# Patient Record
Sex: Male | Born: 1990 | Race: Black or African American | Hispanic: No | Marital: Single | Smoking: Current every day smoker
Health system: Southern US, Community
[De-identification: ages and names within clinical notes are randomized; demographics above are authoritative.]

---

## 2014-04-20 ENCOUNTER — Emergency Department (HOSPITAL_COMMUNITY)
Admission: EM | Admit: 2014-04-20 | Discharge: 2014-04-20 | Disposition: A | Discharge status: Home / Self Care | Payer: Self-pay | Attending: Emergency Medicine | Admitting: Emergency Medicine

## 2014-04-20 ENCOUNTER — Encounter (HOSPITAL_COMMUNITY): Payer: Self-pay | Admitting: Emergency Medicine

## 2014-04-20 ENCOUNTER — Emergency Department (HOSPITAL_COMMUNITY): Payer: Self-pay

## 2014-04-20 DIAGNOSIS — S0083XA Contusion of other part of head, initial encounter: Secondary | ICD-10-CM

## 2014-04-20 DIAGNOSIS — Z720 Tobacco use: Secondary | ICD-10-CM | POA: Insufficient documentation

## 2014-04-20 DIAGNOSIS — Y9389 Activity, other specified: Secondary | ICD-10-CM | POA: Insufficient documentation

## 2014-04-20 DIAGNOSIS — Y998 Other external cause status: Secondary | ICD-10-CM | POA: Insufficient documentation

## 2014-04-20 DIAGNOSIS — X58XXXA Exposure to other specified factors, initial encounter: Secondary | ICD-10-CM | POA: Insufficient documentation

## 2014-04-20 DIAGNOSIS — Y9289 Other specified places as the place of occurrence of the external cause: Secondary | ICD-10-CM | POA: Insufficient documentation

## 2014-04-20 MED ORDER — TRAMADOL HCL 50 MG PO TABS: 50.0000 mg | ORAL_TABLET | Freq: Four times a day (QID) | ORAL | Status: DC | PRN

## 2014-04-20 MED ORDER — ONDANSETRON HCL 4 MG PO TABS: 4.0000 mg | ORAL_TABLET | Freq: Four times a day (QID) | ORAL | Status: DC

## 2014-04-20 MED ORDER — MECLIZINE HCL 25 MG PO TABS: 25.0000 mg | ORAL_TABLET | Freq: Four times a day (QID) | ORAL | Status: DC

## 2014-04-20 MED ORDER — TETRACAINE HCL 0.5 % OP SOLN
2.0000 [drp] | Freq: Once | OPHTHALMIC | Status: AC
  Administered 2014-04-20: 2 [drp] via OPHTHALMIC
  Filled 2014-04-20: qty 2

## 2014-04-20 MED ORDER — FLUORESCEIN SODIUM 1 MG OP STRP
1.0000 | ORAL_STRIP | Freq: Once | OPHTHALMIC | Status: AC
  Administered 2014-04-20: 1 via OPHTHALMIC
  Filled 2014-04-20 (×2): qty 1

## 2014-04-20 NOTE — Discharge Instructions (Signed)
Facial or Scalp Contusion A facial or scalp contusion is a deep bruise on the face or head. Injuries to the face and head generally cause a lot of swelling, especially around the eyes. Contusions are the result of an injury that caused bleeding under the skin. The contusion may turn blue, purple, or yellow. Minor injuries will give you a painless contusion, but more severe contusions may stay painful and swollen for a few weeks.  CAUSES  A facial or scalp contusion is caused by a blunt injury or trauma to the face or head area.  SIGNS AND SYMPTOMS   Swelling of the injured area.   Discoloration of the injured area.   Tenderness, soreness, or pain in the injured area.  DIAGNOSIS  The diagnosis can be made by taking a medical history and doing a physical exam. An X-ray exam, CT scan, or MRI may be needed to determine if there are any associated injuries, such as broken bones (fractures). TREATMENT  Often, the best treatment for a facial or scalp contusion is applying cold compresses to the injured area. Over-the-counter medicines may also be recommended for pain control.  HOME CARE INSTRUCTIONS   Only take over-the-counter or prescription medicines as directed by your health care provider.   Apply ice to the injured area.   Put ice in a plastic bag.   Place a towel between your skin and the bag.   Leave the ice on for 20 minutes, 2-3 times a day.  SEEK MEDICAL CARE IF:  You have bite problems.   You have pain with chewing.   You are concerned about facial defects. SEEK IMMEDIATE MEDICAL CARE IF:  You have severe pain or a headache that is not relieved by medicine.   You have unusual sleepiness, confusion, or personality changes.   You throw up (vomit).   You have a persistent nosebleed.   You have double vision or blurred vision.   You have fluid drainage from your nose or ear.   You have difficulty walking or using your arms or legs.  MAKE SURE YOU:    Understand these instructions.  Will watch your condition.  Will get help right away if you are not doing well or get worse. Document Released: 04/19/2004 Document Revised: 12/31/2012 Document Reviewed: 10/23/2012 [facility] Patient Information 2015 [employer], [place]. This information is not intended to replace advice given to you by your health care provider. Make sure you discuss any questions you have with your health care provider. Eye Contusion An eye contusion is a deep bruise of the eye. This is often called a "black eye." Contusions are the result of an injury that caused bleeding under the skin. The contusion may turn blue, purple, or yellow. Minor injuries will give you a painless contusion, but more severe contusions may stay painful and swollen for a few weeks. If the eye contusion only involves the eyelids and tissues around the eye, the injured area will get better within a few days to weeks. However, eye contusions can be serious and affect the eyeball and sight. CAUSES   Blunt injury or trauma to the face or eye area.  A forehead injury that causes the blood under the skin to work its way down to the eyelids.  Rubbing the eyes due to irritation. SYMPTOMS   Swelling and redness around the eye.  Bruising around the eye.  Tenderness, soreness, or pain around the eye.  Blurry vision.  Tearing.  Eyeball redness. DIAGNOSIS  A diagnosis is  usually based on a thorough exam of the eye and surrounding area. The eye must be looked at carefully to make sure it is not injured and to make sure nothing else will threaten your vision. A vision test may be done. An X-ray or computed tomography (CT) scan may be needed to determine if there are any associated injuries, such as broken bones (fractures). TREATMENT  If there is an injury to the eye, treatment will be determined by the nature of the injury. HOME CARE INSTRUCTIONS   Put ice on the injured area.  Put ice in a plastic  bag.  Place a towel between your skin and the bag.  Leave the ice on for 15-20 minutes, 03-04 times a day.  If it is determined that there is no injury to the eye, you may continue normal activities.  Sunglasses may be worn to protect your eyes from bright light if light is uncomfortable.  Sleep with your head elevated. You can put an extra pillow under your head. This may help with discomfort.  Only take over-the-counter or prescription medicines for pain, discomfort, or fever as directed by your caregiver. Do not take aspirin for the first few days. This may increase bruising. SEEK IMMEDIATE MEDICAL CARE IF:   You have any form of vision loss.  You have double vision.  You feel nauseous.  You feel dizzy, sleepy, or like you will faint.  You have any fluid discharge from the eye or your nose.  You have swelling and discoloration that does not fade. MAKE SURE YOU:   Understand these instructions.  Will watch your condition.  Will get help right away if you are not doing well or get worse. Document Released: 03/09/2000 Document Revised: 06/04/2011 Document Reviewed: 01/26/2011 [facility] Patient Information 2015 [place], [place]. This information is not intended to replace advice given to you by your health care provider. Make sure you discuss any questions you have with your health care provider.
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
## 2016-06-15 ENCOUNTER — Emergency Department (HOSPITAL_COMMUNITY)
Admission: EM | Admit: 2016-06-15 | Discharge: 2016-06-15 | Disposition: A | Discharge status: Home / Self Care | Payer: Self-pay | Attending: Dermatology | Admitting: Dermatology

## 2016-06-15 ENCOUNTER — Encounter (HOSPITAL_COMMUNITY): Payer: Self-pay | Admitting: *Deleted

## 2016-06-15 DIAGNOSIS — Z5321 Procedure and treatment not carried out due to patient leaving prior to being seen by health care provider: Secondary | ICD-10-CM | POA: Insufficient documentation

## 2016-06-15 DIAGNOSIS — M542 Cervicalgia: Secondary | ICD-10-CM | POA: Insufficient documentation
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
## 2016-07-01 ENCOUNTER — Emergency Department (HOSPITAL_COMMUNITY)
Admission: EM | Admit: 2016-07-01 | Discharge: 2016-07-01 | Disposition: A | Discharge status: Home / Self Care | Payer: Self-pay | Attending: Emergency Medicine | Admitting: Emergency Medicine

## 2016-07-01 ENCOUNTER — Encounter (HOSPITAL_COMMUNITY): Payer: Self-pay | Admitting: Emergency Medicine

## 2016-07-01 ENCOUNTER — Emergency Department (HOSPITAL_COMMUNITY): Payer: Self-pay

## 2016-07-01 DIAGNOSIS — M79674 Pain in right toe(s): Secondary | ICD-10-CM

## 2016-07-01 DIAGNOSIS — F17200 Nicotine dependence, unspecified, uncomplicated: Secondary | ICD-10-CM | POA: Insufficient documentation

## 2016-07-01 DIAGNOSIS — Y9389 Activity, other specified: Secondary | ICD-10-CM | POA: Insufficient documentation

## 2016-07-01 DIAGNOSIS — Y999 Unspecified external cause status: Secondary | ICD-10-CM | POA: Insufficient documentation

## 2016-07-01 DIAGNOSIS — Y929 Unspecified place or not applicable: Secondary | ICD-10-CM | POA: Insufficient documentation

## 2016-07-01 DIAGNOSIS — W228XXA Striking against or struck by other objects, initial encounter: Secondary | ICD-10-CM | POA: Insufficient documentation

## 2016-07-01 DIAGNOSIS — S90811A Abrasion, right foot, initial encounter: Secondary | ICD-10-CM | POA: Insufficient documentation

## 2016-07-01 DIAGNOSIS — Z79899 Other long term (current) drug therapy: Secondary | ICD-10-CM | POA: Insufficient documentation

## 2016-07-01 NOTE — Discharge Instructions (Signed)
Please clean your toe with soap and water.  Please wrap ear toe thereafter.  Ibuprofen or Tylenol as needed for discomfort.  Return if any signs of infection present.

## 2016-07-01 NOTE — ED Notes (Signed)
Discharge instructions reviewed with patient. Patient verbalized understanding. 

## 2016-07-01 NOTE — ED Provider Notes (Signed)
[facility]-EMERGENCY DEPT Provider Note   CSN: [id number] Arrival date & time: 07/01/16  1419 By signing my name below, I, Levon Hedger, attest that this documentation has been prepared under the direction and in the presence of non-physician practitioner, Eyvonne Mechanic, PA-C. Electronically Signed: Levon Hedger, Scribe. 07/01/2016. 3:33 PM.   History   Chief Complaint Chief Complaint  Patient presents with  . Foot Pain   HPI William Snow is a 26 y.o. male who presents to the Emergency Department complaining of sudden onset, constant, moderate right lateral foot pain onset last night. Pt states he struck his foot on a wooden bed board last night while chasing his dog. Pain is exacerbated by pressure and movement. He notes associated swelling and an abrasion to his right 2nd toe. No prior treatments indicated. Pt has no other acute complaints or associated symptoms at this time.  Tetanus UTD.   The history is provided by the patient. No language interpreter was used.   History reviewed. No pertinent past medical history.  There are no active problems to display for this patient.  History reviewed. No pertinent surgical history.   Home Medications    Prior to Admission medications   Medication Sig Start Date End Date Taking? Authorizing Provider  traMADol (ULTRAM) 50 MG tablet Take 1 tablet (50 mg total) by mouth every 6 (six) hours as needed. 04/20/14   Marlon Pel, PA-C   Family History No family history on file.  Social History Social History  Substance Use Topics  . Smoking status: Current Every Day Smoker  . Smokeless tobacco: Never Used  . Alcohol use Yes     Allergies   Patient has no known allergies.   Review of Systems Review of Systems 10 systems reviewed and are negative for acute change except as noted in the HPI.  Physical Exam Updated Vital Signs BP 109/62 (BP Location: Left Arm)   Pulse 62   Temp 98.1 F (36.7 C) (Oral)   Resp 16   SpO2 97%    Physical Exam  Constitutional: He is oriented to person, place, and time. He appears well-developed and well-nourished. No distress.  HENT:  Head: Normocephalic and atraumatic.  Eyes: Conjunctivae are normal.  Cardiovascular: Normal rate.   Pulmonary/Chest: Effort normal.  Abdominal: He exhibits no distension.  Musculoskeletal:  Superficial abrasion and minor bruising noted to the second digit right foot no full thickness wound, no other signs of trauma.  Neurological: He is alert and oriented to person, place, and time.  Skin: Skin is warm and dry.  Psychiatric: He has a normal mood and affect.  Nursing note and vitals reviewed.  ED Treatments / Results  DIAGNOSTIC STUDIES:  Oxygen Saturation is 100% on RA, normal by my interpretation.    COORDINATION OF CARE:  3:33 PM Discussed treatment plan with pt at bedside and pt agreed to plan.   Labs (all labs ordered are listed, but only abnormal results are displayed) Labs Reviewed - No data to display  EKG  EKG Interpretation None       Radiology Dg Foot Complete Right  Result Date: 07/01/2016 CLINICAL DATA:  Hit foot on bed frame today. Lateral right foot pain. Initial encounter. EXAM: RIGHT FOOT COMPLETE - 3+ VIEW COMPARISON:  None. FINDINGS: There is no evidence of fracture or dislocation. There is no evidence of arthropathy or other focal bone abnormality. Soft tissues are unremarkable. IMPRESSION: Negative. Electronically Signed   By: Myles Rosenthal M.D.   On: 07/01/2016 15:02  Procedures Procedures (including critical care time)  Medications Ordered in ED Medications - No data to display   Initial Impression / Assessment and Plan / ED Course  I have reviewed the triage vital signs and the nursing notes.  Pertinent labs & imaging results that were available during my care of the patient were reviewed by me and considered in my medical decision making (see chart for details).     Patient presents with  superficial abrasion to his toe, no acute fracture.  Tetanus is up-to-date, symptomatic care instructions given wound care instructions given.  Return precautions given.  Patient verbalized understanding and agreement to today's plan  Final Clinical Impressions(s) / ED Diagnoses   Final diagnoses:  Toe pain, right    New Prescriptions Discharge Medication List as of 07/01/2016  4:31 PM    I personally performed the services described in this documentation, which was scribed in my presence. The recorded information has been reviewed and is accurate.    Eyvonne Mechanic, PA-C 07/01/16 1703    Doug Sou, MD 07/02/16 [phone]

## 2016-07-01 NOTE — ED Triage Notes (Signed)
Pt c/o right foot pain onset last night after striking right 2nd toe of right foot on wooden bedboard. Pain to toe and dorsal foot. Small laceration to 2nd toe.

## 2017-05-03 IMAGING — CR DG FOOT COMPLETE 3+V*R*
3 series · 3 of 3 positions shown · non-contrast
Comparison: None.

CLINICAL DATA: Hit foot on bed frame today. Lateral right foot
pain. Initial encounter.

EXAM:
RIGHT FOOT COMPLETE - 3+ VIEW

[x foot lat right]
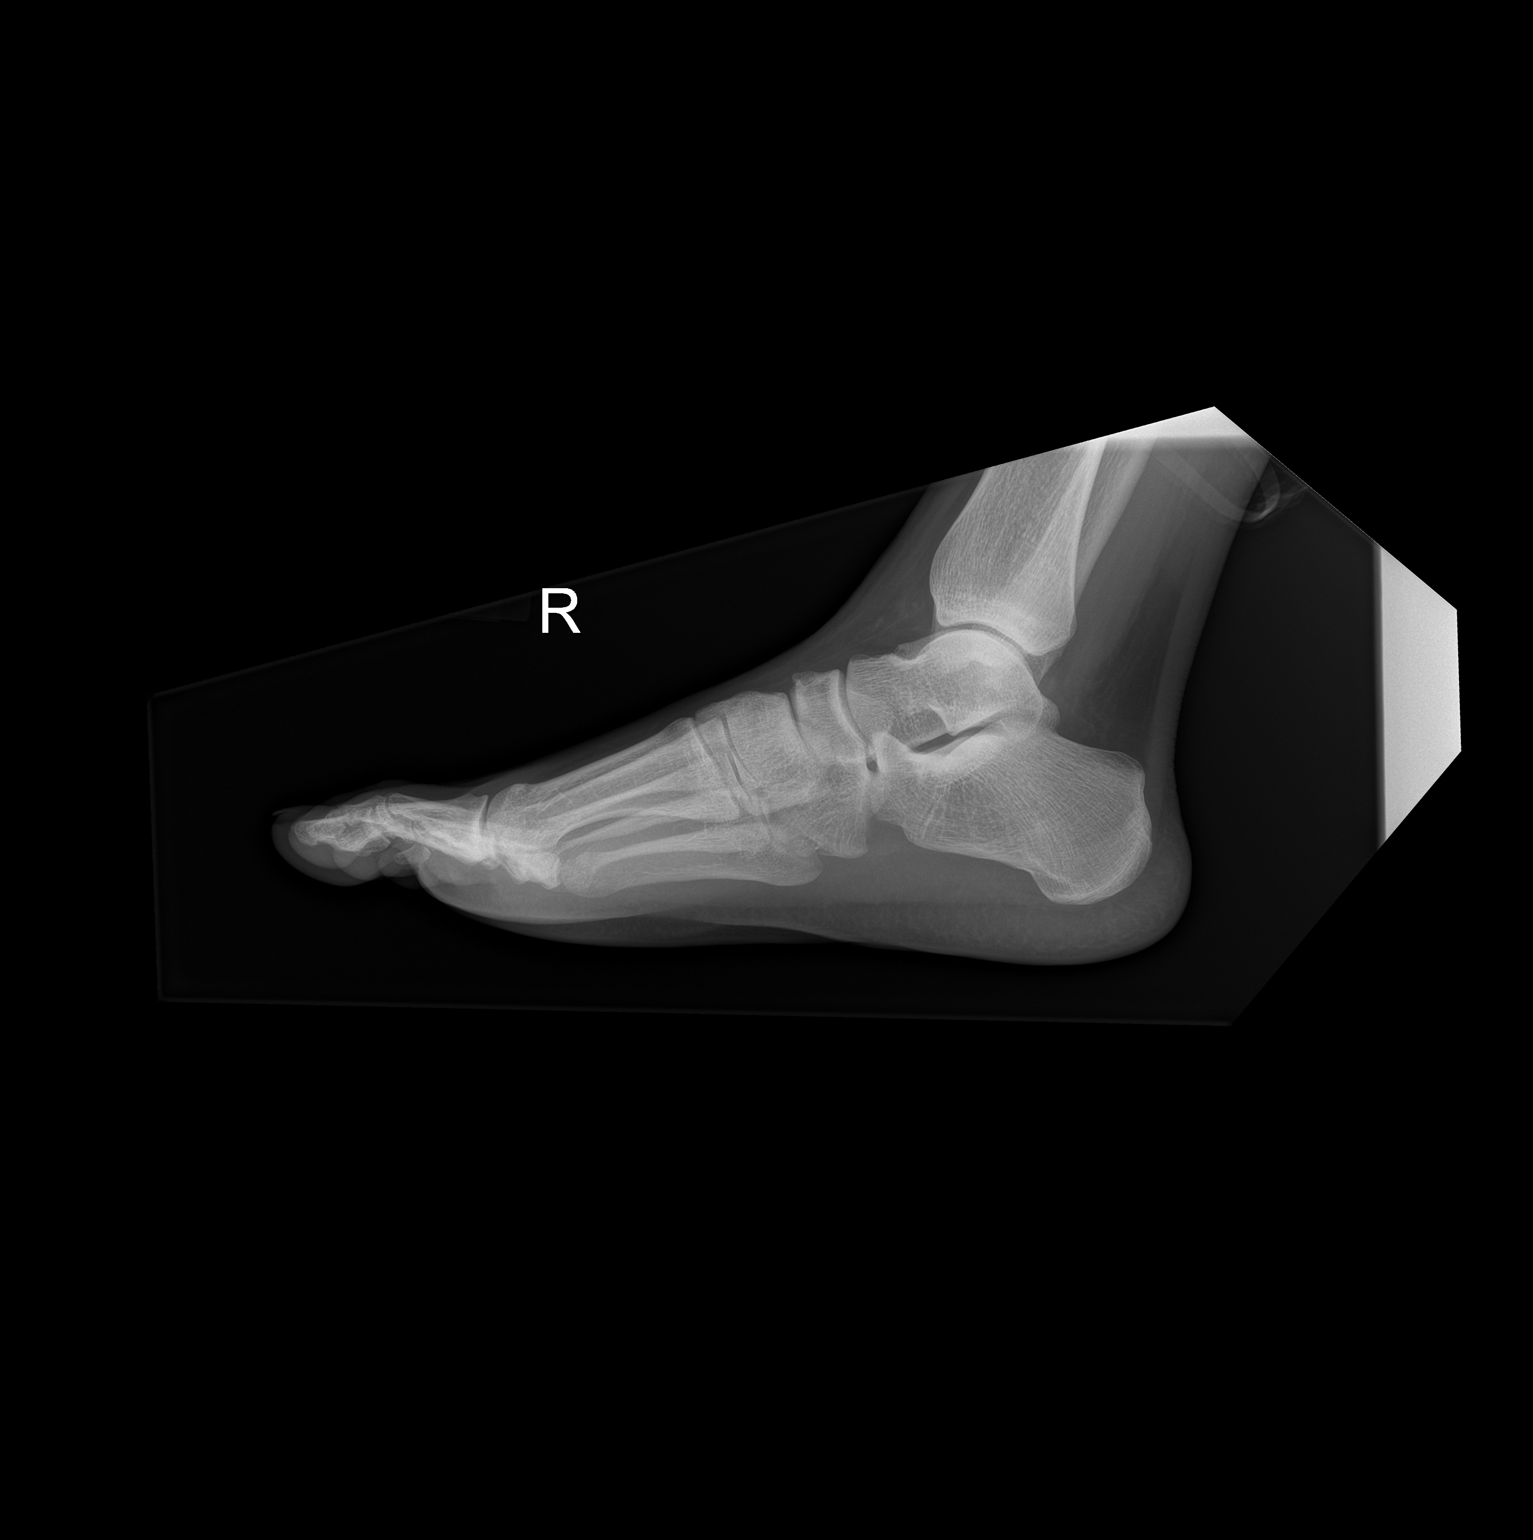

[x foot ap right]
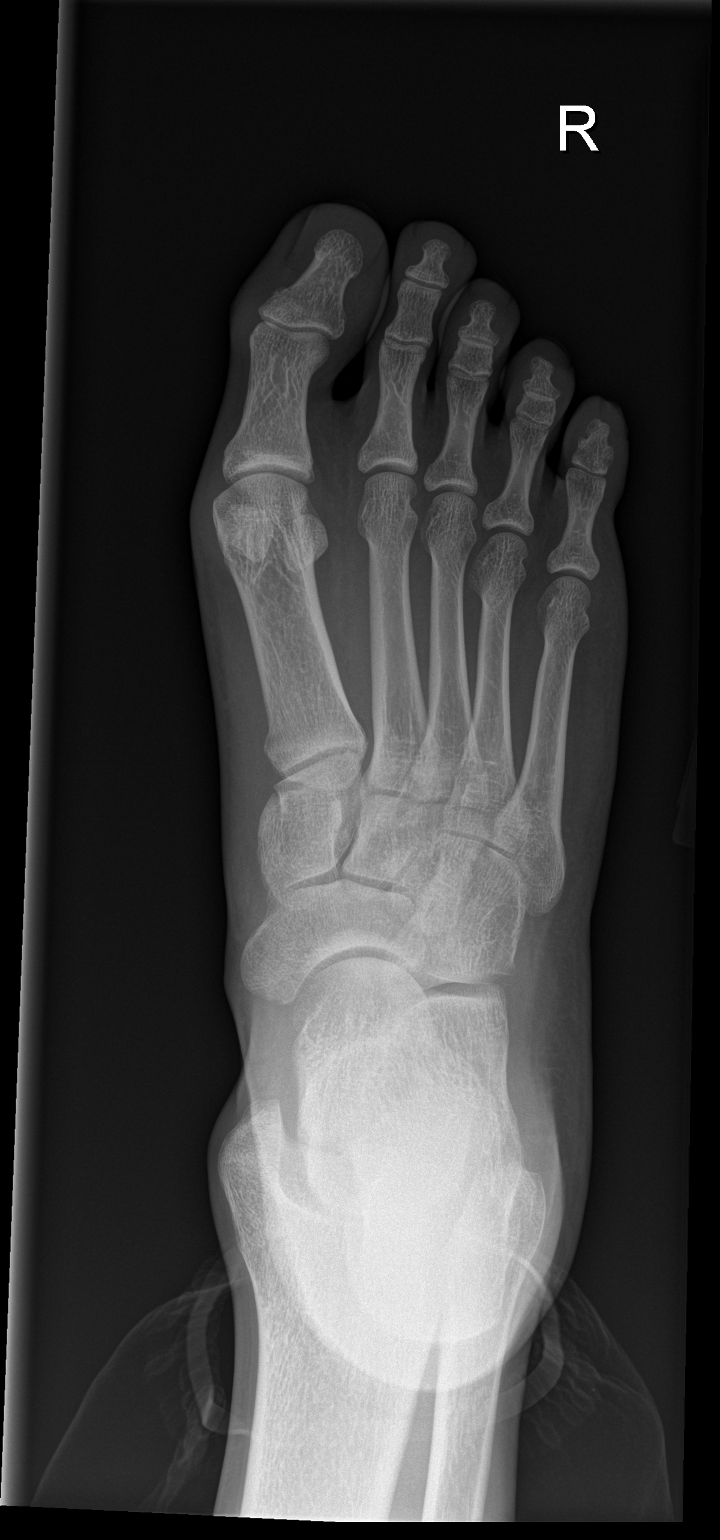

[x foot obl right]
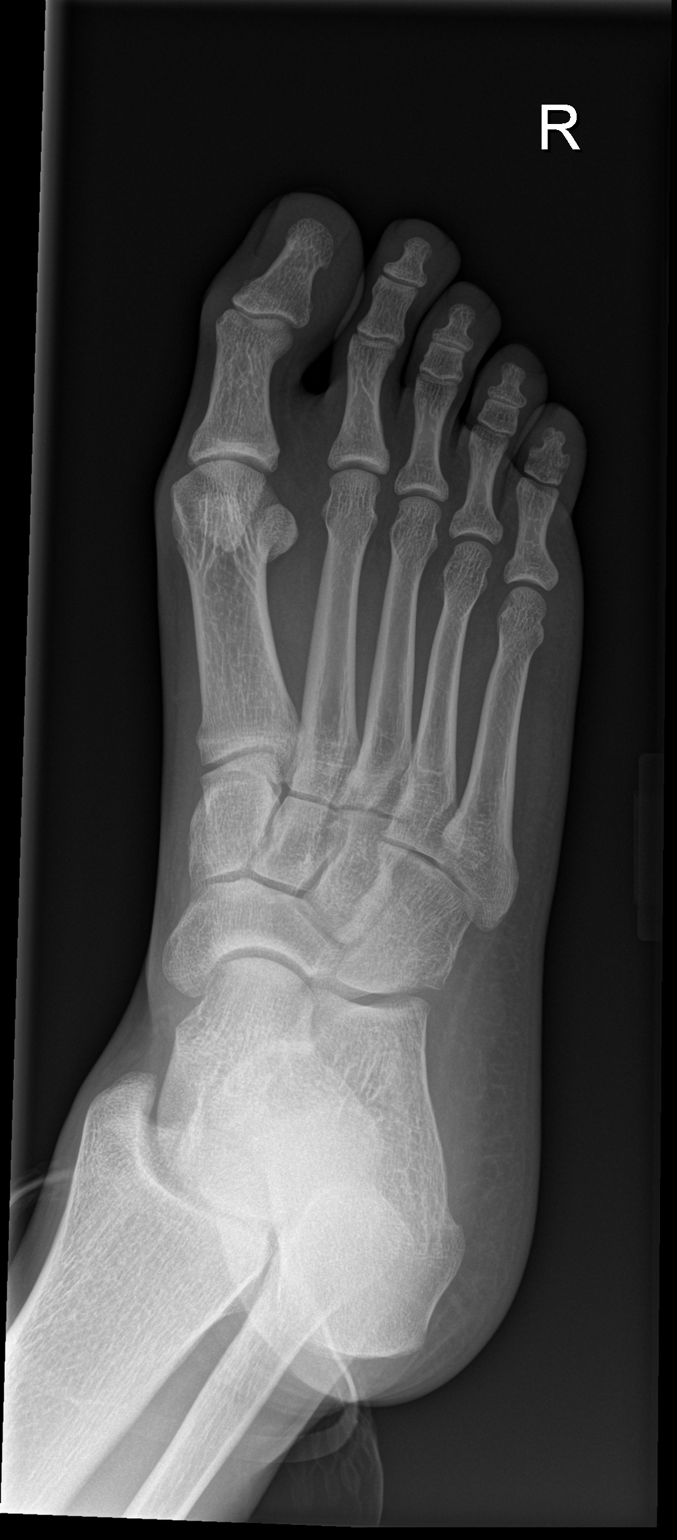

[3 of 3 positions shown; findings below may reference images not displayed]

FINDINGS: There is no evidence of fracture or dislocation. There is no
evidence of arthropathy or other focal bone abnormality. Soft
tissues are unremarkable.
IMPRESSION: Negative.

## 2019-09-17 ENCOUNTER — Emergency Department (HOSPITAL_COMMUNITY)
Admission: EM | Admit: 2019-09-17 | Discharge: 2019-09-20 | Disposition: A | Discharge status: Other Acute Inpatient Hospital | Attending: Emergency Medicine | Admitting: Emergency Medicine

## 2019-09-17 ENCOUNTER — Encounter (HOSPITAL_COMMUNITY): Payer: Self-pay

## 2019-09-17 DIAGNOSIS — F17200 Nicotine dependence, unspecified, uncomplicated: Secondary | ICD-10-CM | POA: Insufficient documentation

## 2019-09-17 DIAGNOSIS — F309 Manic episode, unspecified: Secondary | ICD-10-CM | POA: Insufficient documentation

## 2019-09-17 DIAGNOSIS — Z20822 Contact with and (suspected) exposure to covid-19: Secondary | ICD-10-CM | POA: Insufficient documentation

## 2019-09-17 DIAGNOSIS — F19959 Other psychoactive substance use, unspecified with psychoactive substance-induced psychotic disorder, unspecified: Secondary | ICD-10-CM

## 2019-09-17 DIAGNOSIS — F29 Unspecified psychosis not due to a substance or known physiological condition: Secondary | ICD-10-CM | POA: Insufficient documentation

## 2019-09-17 LAB — RAPID URINE DRUG SCREEN, HOSP PERFORMED
Amphetamines: NOT DETECTED
Barbiturates: NOT DETECTED
Benzodiazepines: NOT DETECTED
Cocaine: NOT DETECTED
Opiates: NOT DETECTED
Tetrahydrocannabinol: POSITIVE — AB

## 2019-09-17 LAB — BASIC METABOLIC PANEL
Anion gap: 11 (ref 5–15)
BUN: 8 mg/dL (ref 6–20)
CO2: 23 mmol/L (ref 22–32)
Calcium: 9.5 mg/dL (ref 8.9–10.3)
Chloride: 103 mmol/L (ref 98–111)
Creatinine, Ser: 0.79 mg/dL (ref 0.61–1.24)
GFR calc Af Amer: >60 mL/min (ref >60)
GFR calc non Af Amer: >60 mL/min (ref >60)
Glucose, Bld: 105 mg/dL — ABNORMAL HIGH (ref 70–99)
Potassium: 3.8 mmol/L (ref 3.5–5.1)
Sodium: 137 mmol/L (ref 135–145)

## 2019-09-17 LAB — ETHANOL: Alcohol, Ethyl (B): <10 mg/dL (ref <10)

## 2019-09-17 LAB — ACETAMINOPHEN LEVEL: Acetaminophen (Tylenol), Serum: <10 ug/mL — ABNORMAL LOW (ref 10–30)

## 2019-09-17 LAB — SARS CORONAVIRUS 2 BY RT PCR (HOSPITAL ORDER, PERFORMED IN ~~LOC~~ HOSPITAL LAB): SARS Coronavirus 2: NEGATIVE

## 2019-09-17 LAB — CBC WITH DIFFERENTIAL/PLATELET
Abs Immature Granulocytes: 0.01 10*3/uL (ref 0.00–0.07)
Basophils Absolute: 0 10*3/uL (ref 0.0–0.1)
Basophils Relative: 1 %
Eosinophils Absolute: 0.1 10*3/uL (ref 0.0–0.5)
Eosinophils Relative: 2 %
HCT: 46.1 % (ref 39.0–52.0)
Hemoglobin: 14.7 g/dL (ref 13.0–17.0)
Immature Granulocytes: 0 %
Lymphocytes Relative: 45 %
Lymphs Abs: 2.6 10*3/uL (ref 0.7–4.0)
MCH: 31 pg (ref 26.0–34.0)
MCHC: 31.9 g/dL (ref 30.0–36.0)
MCV: 97.3 fL (ref 80.0–100.0)
Monocytes Absolute: 0.4 10*3/uL (ref 0.1–1.0)
Monocytes Relative: 8 %
Neutro Abs: 2.5 10*3/uL (ref 1.7–7.7)
Neutrophils Relative %: 44 %
Platelets: 232 10*3/uL (ref 150–400)
RBC: 4.74 MIL/uL (ref 4.22–5.81)
RDW: 12.5 % (ref 11.5–15.5)
WBC: 5.7 10*3/uL (ref 4.0–10.5)
nRBC: 0 % (ref 0.0–0.2)

## 2019-09-17 LAB — CBG MONITORING, ED: Glucose-Capillary: 92 mg/dL (ref 70–99)

## 2019-09-17 MED ORDER — ZIPRASIDONE MESYLATE 20 MG IM SOLR
10.0000 mg | Freq: Once | INTRAMUSCULAR | Status: AC
  Administered 2019-09-17: 10 mg via INTRAMUSCULAR
  Filled 2019-09-17: qty 20

## 2019-09-17 MED ORDER — STERILE WATER FOR INJECTION IJ SOLN
INTRAMUSCULAR | Status: AC
  Administered 2019-09-17: 10 mL
  Filled 2019-09-17: qty 10

## 2019-09-17 MED ORDER — STERILE WATER FOR INJECTION IJ SOLN
INTRAMUSCULAR | Status: AC
  Filled 2019-09-17: qty 10

## 2019-09-17 NOTE — ED Notes (Signed)
Dinner ordered 

## 2019-09-17 NOTE — ED Notes (Signed)
Pt resting quietly at this time.  [employer] at bedside.

## 2019-09-17 NOTE — ED Notes (Signed)
Pt's brother's phone number  [phone]
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
## 2019-09-17 NOTE — ED Notes (Signed)
Pt's belongings inventoried and placed in [place] [address]
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
## 2019-09-18 DIAGNOSIS — F19959 Other psychoactive substance use, unspecified with psychoactive substance-induced psychotic disorder, unspecified: Secondary | ICD-10-CM

## 2019-09-18 DIAGNOSIS — F29 Unspecified psychosis not due to a substance or known physiological condition: Secondary | ICD-10-CM

## 2019-09-18 MED ORDER — LORAZEPAM 1 MG PO TABS
2.0000 mg | ORAL_TABLET | Freq: Once | ORAL | Status: AC | PRN
  Administered 2019-09-18: 2 mg via ORAL
  Filled 2019-09-18: qty 2

## 2019-09-18 MED ORDER — OLANZAPINE 5 MG PO TABS
5.0000 mg | ORAL_TABLET | Freq: Every day | ORAL | Status: DC
  Administered 2019-09-18 – 2019-09-19 (×3): 5 mg via ORAL
  Filled 2019-09-18 (×3): qty 1

## 2019-09-18 MED ORDER — OLANZAPINE 5 MG PO TABS
5.0000 mg | ORAL_TABLET | Freq: Every day | ORAL | Status: DC
  Filled 2019-09-18: qty 1
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
## 2019-09-18 NOTE — ED Notes (Signed)
zyprexa 5mg  given but  the

## 2019-09-18 NOTE — ED Notes (Signed)
Dinner Tray Ordered @ 1817. 

## 2019-09-18 NOTE — ED Notes (Signed)
Lunch Tray Ordered @ 1048. °

## 2019-09-18 NOTE — ED Provider Notes (Signed)
Emergency Medicine Observation Re-evaluation Note  William Snow is a 29 y.o. male, seen on rounds today.  Pt initially presented to the ED for complaints of Medical Clearance Currently, the patient is sitting in bed.  Some pressured speech but amicable, able answer questions appropriately follow commands.  Denies any SI, AVH, HI.   Physical Exam  BP 128/88 (BP Location: Right Arm)   Pulse 78   Temp 98.1 F (36.7 C) (Oral)   Resp 16   SpO2 99%  Physical Exam Vitals and nursing note reviewed.  Constitutional:      General: He is not in acute distress.    Appearance: Normal appearance. He is not ill-appearing.  HENT:     Head: Normocephalic and atraumatic.  Eyes:     General: No scleral icterus.       Right eye: No discharge.        Left eye: No discharge.     Conjunctiva/sclera: Conjunctivae normal.  Pulmonary:     Effort: Pulmonary effort is normal.     Breath sounds: No stridor.  Neurological:     Mental Status: He is alert and oriented to person, place, and time. Mental status is at baseline.  Psychiatric:     Comments: Pressured speech, some tangential thoughts.     ED Course / MDM  EKG:EKG Interpretation  Date/Time:  Thursday September 17 2019 17:23:17 EDT Ventricular Rate:  52 PR Interval:    QRS Duration: 88 QT Interval:  387 QTC Calculation: 360 R Axis:   71 Text Interpretation: no oldSinus rhythm RSR' i n V1 or V2, probably normal variant Borderline T wave abnormalities Borderline ST elevation, anterior leads Confirmed by Pricilla Loveless [phone]) on 09/17/2019 5:49:24 PM Also confirmed by Pricilla Loveless [phone]), editor Trenton, LaVerne ([id number])  on 09/18/2019 8:57:42 AM    I have reviewed the labs performed to date as well as medications administered while in observation.  Recent changes in the last 24 hours include recommendation for Zyprexa 5 mg at bedtime. Plan  Current plan is for inpatient psychiatric admission.  Placement pending at this time.  As psychiatry  has recommended Zyprexa I will go ahead and administer that p.o.  Patient is agreeable to this. Patient is under full IVC at this time.     Gailen Shelter, [place] 09/18/19 1506    Sabas Sous, MD 09/18/19 [phone]

## 2019-09-18 NOTE — Consult Note (Signed)
Telepsych Consultation   Reason for Consult:  Psychosis Referring Physician: EDP Location of Patient: [facility] Location of Provider: Behavioral Health TTS Department  Patient Identification: Oleg Oleson MRN:  [id number] Principal Diagnosis: Psychosis (HCC) Diagnosis:  Principal Problem:   Psychosis (HCC)   Total Time spent with patient: 20 minutes  Subjective:   Hodges Treiber is a 29 y.o. male patient admitted with symptoms of mood instability and psychosis.   HPI:    Per initial [facility] assessment by Jenny Reichmann, [facility]:   Pt presents involuntary and unaccompanied to [facility]. Pt was a poor historian during the assessment. Clinician observed the pt was on the phone before engaging in the assessment. Clinician asked the pt, "what brought you to the hospital?" Pt reported, "my mama, [name]." Clinician attempted to ask the pt questions however the call ended twice and he answered most questions with questions. Pt reported, "I just took your powers." Pt continued to ask his nurse questions. Pt reported, "time to grab my clothes." Clinician asked the pt if he had suicidal or homicidal thoughts. Pt replied, "clearly you do, I never said that."   Per IVC paperwork: "He is a danger to himself and others. He keeps talking to himself and talking to cars in the parking lot. He also keeps mentioning how keeps hearing voices and seeing people. He keep shaving mood swing but mostly talking really aggressive and being disruptive. His behaviors is disruptive and causing neighbors to panic police has been called. He left home a week ago and hasn't been same since he returned. Officers have spoken to him but  refuses help while being disruptive. Family is concerned he was given bad drugs while gone from home."   Per am psychiatric assessment 09/18/2019:  Patient is seen and chart is reviewed. The patient has poor insight into his reasons for being in the ED. He continues to blame his mother and  insists that she is the one with mental illness. Patient states "I'm not crazy. But my mother wants me to be locked up. My mother does witchcraft. I'm not the one that shut the door in fear when a cat came up. You know that means that there are spirits around right? I don't do anything wrong but smoke some weed." The patient appeared agitated at times during the assessment when talking about his mother. Due to probable psychosis the patient continues to meet criteria for inpatient psychiatric treatment.  Past Psychiatric History: Unknown as patient is a poor historian  Risk to Self:   Risk to Others:   Prior Inpatient Therapy:   Prior Outpatient Therapy:    Past Medical History: History reviewed. No pertinent past medical history. History reviewed. No pertinent surgical history. Family History: History reviewed. No pertinent family history. Family Psychiatric  History: Unknown Social History:  Social History   Substance and Sexual Activity  Alcohol Use Yes     Social History   Substance and Sexual Activity  Drug Use Yes  . Types: Marijuana    Social History   Socioeconomic History  . Marital status: Single    Spouse name: Not on file  . Number of children: Not on file  . Years of education: Not on file  . Highest education level: Not on file  Occupational History  . Not on file  Tobacco Use  . Smoking status: Current Every Day Smoker  . Smokeless tobacco: Never Used  Substance and Sexual Activity  . Alcohol use: Yes  . Drug use: Yes  Types: Marijuana  . Sexual activity: Not on file  Other Topics Concern  . Not on file  Social History Narrative  . Not on file   Social Determinants of Health   Financial Resource Strain:   . Difficulty of Paying Living Expenses:   Food Insecurity:   . Worried About Programme researcher, broadcasting/film/video in the Last Year:   . Barista in the Last Year:   Transportation Needs:   . Freight forwarder (Medical):   Marland Kitchen Lack of Transportation  (Non-Medical):   Physical Activity:   . Days of Exercise per Week:   . Minutes of Exercise per Session:   Stress:   . Feeling of Stress :   Social Connections:   . Frequency of Communication with Friends and Family:   . Frequency of Social Gatherings with Friends and Family:   . Attends Religious Services:   . Active Member of Clubs or Organizations:   . Attends Banker Meetings:   Marland Kitchen Marital Status:    Additional Social History:    Allergies:  No Known Allergies  Labs:  Results for orders placed or performed during the hospital encounter of 09/17/19 (from the past 48 hour(s))  CBG monitoring, ED     Status: None   Collection Time: 09/17/19  3:57 PM  Result Value Ref Range   Glucose-Capillary 92 70 - 99 mg/dL    Comment: Glucose reference range applies only to samples taken after fasting for at least 8 hours.  Acetaminophen level     Status: Abnormal   Collection Time: 09/17/19  4:34 PM  Result Value Ref Range   Acetaminophen (Tylenol), Serum <10 (L) 10 - 30 ug/mL    Comment: (NOTE) Therapeutic concentrations vary significantly. A range of 10-30 ug/mL  may be an effective concentration for many patients. However, some  are best treated at concentrations outside of this range. Acetaminophen concentrations >150 ug/mL at 4 hours after ingestion  and >50 ug/mL at 12 hours after ingestion are often associated with  toxic reactions.  Performed at [facility], [address], [place], [place] [address]   Ethanol     Status: None   Collection Time: 09/17/19  4:34 PM  Result Value Ref Range   Alcohol, Ethyl (B) <10 <10 mg/dL    Comment: (NOTE) Lowest detectable limit for serum alcohol is 10 mg/dL.  For medical purposes only. Performed at [facility], [address] [place], [place] [address]   Basic metabolic panel     Status: Abnormal   Collection Time: 09/17/19  4:35 PM  Result Value Ref Range   Sodium 137 135 - 145 mmol/L   Potassium 3.8  3.5 - 5.1 mmol/L   Chloride 103 98 - 111 mmol/L   CO2 23 22 - 32 mmol/L   Glucose, Bld 105 (H) 70 - 99 mg/dL    Comment: Glucose reference range applies only to samples taken after fasting for at least 8 hours.   BUN 8 6 - 20 mg/dL   Creatinine, Ser 3.24 0.61 - 1.24 mg/dL   Calcium 9.5 8.9 - 40.1 mg/dL   GFR calc non Af Amer >60 >60 mL/min   GFR calc Af Amer >60 >60 mL/min   Anion gap 11 5 - 15    Comment: Performed at [facility], [address] [place], [place] [address]  CBC with Differential     Status: None   Collection Time: 09/17/19  4:35 PM  Result Value Ref Range   WBC 5.7 4.0 - 10.5 K/uL   RBC 4.74 4.22 - 5.81 MIL/uL   Hemoglobin 14.7 13.0 - 17.0 g/dL   HCT 46.1 39 - 52 %   MCV 97.3 80.0 - 100.0 fL   MCH 31.0 26.0 - 34.0 pg   MCHC 31.9 30.0 - 36.0 g/dL   RDW 12.5 11.5 - 15.5 %   Platelets 232 150 - 400 K/uL   nRBC 0.0 0.0 - 0.2 %   Neutrophils Relative % 44 %   Neutro Abs 2.5 1.7 - 7.7 K/uL   Lymphocytes Relative 45 %   Lymphs Abs 2.6 0.7 - 4.0 K/uL   Monocytes Relative 8 %   Monocytes Absolute 0.4 0 - 1 K/uL   Eosinophils Relative 2 %   Eosinophils Absolute 0.1 0 - 0 K/uL   Basophils Relative 1 %   Basophils Absolute 0.0 0 - 0 K/uL   Immature Granulocytes 0 %   Abs Immature Granulocytes 0.01 0.00 - 0.07 K/uL    Comment: Performed at [facility], [address], [place], [place] [address]  SARS Coronavirus 2 by RT PCR (hospital order, performed in [facility] hospital lab) Nasopharyngeal Urine, Clean Catch     Status: None   Collection Time: 09/17/19  7:22 PM   Specimen: Urine, Clean Catch; Nasopharyngeal  Result Value Ref Range   SARS Coronavirus 2 NEGATIVE NEGATIVE    Comment: (NOTE) SARS-CoV-2 target nucleic acids are NOT DETECTED.  The SARS-CoV-2 RNA is generally detectable in upper and lower respiratory specimens during the acute phase of infection. The lowest concentration of SARS-CoV-2 viral copies this assay can detect is 250 copies /  mL. A negative result does not preclude SARS-CoV-2 infection and should not be used as the sole basis for treatment or other patient management decisions.  A negative result may occur with improper specimen collection / handling, submission of specimen other than nasopharyngeal swab, presence of viral mutation(s) within the areas targeted by this assay, and inadequate number of viral copies (<250 copies / mL). A negative result must be combined with clinical observations, patient history, and epidemiological information.  Fact Sheet for Patients:   StrictlyIdeas.no  Fact Sheet for Healthcare Providers: BankingDealers.co.za  This test is not yet approved or  cleared by the [place] FDA and has been authorized for detection and/or diagnosis of SARS-CoV-2 by FDA under an Emergency Use Authorization (EUA).  This EUA will remain in effect (meaning this test can be used) for the duration of the COVID-19 declaration under Section 564(b)(1) of the Act, 21 U.S.C. section 360bbb-3(b)(1), unless the authorization is terminated or revoked sooner.  Performed at [facility], [place] [address], [place],  [address]   Urine rapid drug screen (hosp performed)     Status: Abnormal   Collection Time: 09/17/19  7:38 PM  Result Value Ref Range   Opiates NONE DETECTED NONE DETECTED   Cocaine NONE DETECTED NONE DETECTED   Benzodiazepines NONE DETECTED NONE DETECTED   Amphetamines NONE DETECTED NONE DETECTED   Tetrahydrocannabinol POSITIVE (A) NONE DETECTED   Barbiturates NONE DETECTED NONE DETECTED    Comment: (NOTE) DRUG SCREEN FOR MEDICAL PURPOSES ONLY.  IF CONFIRMATION IS NEEDED FOR ANY PURPOSE, NOTIFY LAB WITHIN 5 DAYS.  LOWEST DETECTABLE LIMITS FOR URINE DRUG SCREEN Drug Class                     Cutoff (ng/mL) Amphetamine  and metabolites    1000 Barbiturate and metabolites    200 Benzodiazepine                 200 Tricyclics  and metabolites     300 Opiates and metabolites        300 Cocaine and metabolites        300 THC                            50 Performed at [facility], [address], [place], [place] [address]     Medications:  No current facility-administered medications for this encounter.   Current Outpatient Medications  Medication Sig Dispense Refill  . traMADol (ULTRAM) 50 MG tablet Take 1 tablet (50 mg total) by mouth every 6 (six) hours as needed. (Patient not taking: Reported on 09/17/2019) 15 tablet 0    Musculoskeletal:  Unable to assess via camera  Psychiatric Specialty Exam: Physical Exam  Psychiatric: Memory normal. His affect is labile. His speech is rapid and/or pressured. He is agitated. Thought content is paranoid and delusional. He expresses impulsivity.    Review of Systems  Blood pressure 128/88, pulse 78, temperature 98.1 F (36.7 C), temperature source Oral, resp. rate 16, SpO2 99 %.There is no height or weight on file to calculate BMI.  General Appearance: Disheveled  Eye Contact:  Good  Speech:  Pressured  Volume:  Increased  Mood:  Irritable  Affect:  Labile  Thought Process:  Coherent  Orientation:  Full (Time, Place, and Person)  Thought Content:  Delusions, Paranoid Ideation and Rumination  Suicidal Thoughts:  No  Homicidal Thoughts:  No  Memory:  Immediate;   Fair Recent;   Fair Remote;   Fair  Judgement:  Poor  Insight:  Lacking  Psychomotor Activity:  Increased  Concentration:  Concentration: Fair and Attention Span: Fair  Recall:  [employer] of Knowledge:  Good  Language:  Good  Akathisia:  No  Handed:  Right  AIMS (if indicated):     Assets:  Communication Skills Desire for Improvement Housing Intimacy Leisure Time Physical Health Resilience Social Support  ADL's:  Intact  Cognition:  WNL  Sleep:        Treatment Plan Summary: Plan Plan for psychiatric inpatient admission. Start zyprexa 5 mg po hs for  psychosis  Disposition: Recommend psychiatric Inpatient admission when medically cleared. Supportive therapy provided about ongoing stressors. Discussed crisis plan, support from social network, calling 911, coming to the Emergency Department, and calling Suicide Hotline.  This service was provided via telemedicine using a 2-way, interactive audio and video technology.  Names of all persons participating in this telemedicine service and their role in this encounter. Name: Fransisca Kaufmann  Role: NP  Name: Sheliah Mends Role: Patient  Name:  Role:   Name:  Role:     Fransisca Kaufmann, NP 09/18/2019 1:58 PM

## 2019-09-18 NOTE — ED Notes (Signed)
TTS in progress 

## 2019-09-18 NOTE — BHH Counselor (Signed)
Pt's mother [name], [phone]) would like a call if the pt is discharged. Per mother she does not think the pt is safe outside of the hospital.    Redmond Pulling, MS, [facility] Triage Specialist [phone]

## 2019-09-19 ENCOUNTER — Other Ambulatory Visit: Payer: Self-pay

## 2019-09-19 MED ORDER — ZIPRASIDONE MESYLATE 20 MG IM SOLR
20.0000 mg | Freq: Four times a day (QID) | INTRAMUSCULAR | Status: DC | PRN
  Administered 2019-09-19: 20 mg via INTRAMUSCULAR
  Filled 2019-09-19: qty 20

## 2019-09-19 MED ORDER — ZIPRASIDONE MESYLATE 20 MG IM SOLR
10.0000 mg | Freq: Four times a day (QID) | INTRAMUSCULAR | Status: DC | PRN
  Administered 2019-09-19: 10 mg via INTRAMUSCULAR
  Filled 2019-09-19: qty 20

## 2019-09-19 MED ORDER — STERILE WATER FOR INJECTION IJ SOLN
INTRAMUSCULAR | Status: AC
  Filled 2019-09-19: qty 10

## 2019-09-19 NOTE — ED Notes (Signed)
Pt in and out of room. Talking a lot to sitters and this RN. Cooperative at this time. Redirectable at this time. Sitter with pt.

## 2019-09-19 NOTE — BH Assessment (Signed)
Upon chart review Arvilla Market NP reports patient continues to meet inpatient criteria as appropriate bed placement is investigated.

## 2019-09-19 NOTE — ED Provider Notes (Signed)
Rn reports pt has increased agitation.  Pt has history of psychosis and mania.  Pt appears maniac.  Talking rapidly  Geodon 10 mg ordered   Osie Cheeks 09/19/19 1155    Margarita Grizzle, MD 09/24/19 [phone]

## 2019-09-19 NOTE — Progress Notes (Signed)
CSW notified that [facility] has no bed availability.  TTS will continue to seek placement.   Vilma Meckel. Algis Greenhouse, MSW, LCSW Clinical Social Work/Disposition Phone: [phone] Fax: [phone]

## 2019-09-19 NOTE — ED Notes (Signed)
Lunch was ordered

## 2019-09-19 NOTE — ED Notes (Signed)
Breakfast Ordered 

## 2019-09-19 NOTE — ED Provider Notes (Addendum)
Patient awaiting placement at [facility], meets inpatient criteria.  He is under IVC.  Required Geodon earlier this morning due to increased agitation and mania.  He is resting comfortably in bed on my assessment.  No complaints.   Jeanie Sewer, PA-C 09/19/19 1216  Addendum: Per NP Arvilla Market, okay to increase Geodon dose to 20mg  IM q6 hours prn agitation.   09/19/19 1359    09/21/19, MD 09/19/19 2003

## 2019-09-19 NOTE — Progress Notes (Signed)
Patient meets criteria for inpatient treatment. No appropriate or available beds at [facility]. CSW faxed referrals to the following facilities for review:  [facility]   [facility]   [facility]   [facility]   [facility]   [facility]   [facility]   [facility]   [facility]   [facility]    TTS will continue to seek bed placement.  Vilma Meckel. Algis Greenhouse, MSW, LCSW Clinical Social Work/Disposition Phone: [phone] Fax: [phone]

## 2019-09-19 NOTE — ED Notes (Signed)
Pt displaying increasing agitation, however cooperative with IM injections at this time

## 2019-09-19 NOTE — ED Notes (Signed)
Pt cooperative with IM injection.

## 2019-09-19 NOTE — ED Notes (Signed)
Patient denies pain and is resting comfortably.  

## 2019-09-19 NOTE — ED Notes (Signed)
Pt received dietary tray °

## 2019-09-20 DIAGNOSIS — F309 Manic episode, unspecified: Secondary | ICD-10-CM | POA: Insufficient documentation

## 2019-09-20 DIAGNOSIS — F19959 Other psychoactive substance use, unspecified with psychoactive substance-induced psychotic disorder, unspecified: Secondary | ICD-10-CM

## 2019-09-20 LAB — CBG MONITORING, ED: Glucose-Capillary: 83 mg/dL (ref 70–99)

## 2019-09-20 MED ORDER — ZIPRASIDONE MESYLATE 20 MG IM SOLR
20.0000 mg | Freq: Two times a day (BID) | INTRAMUSCULAR | Status: DC | PRN
  Administered 2019-09-20: 20 mg via INTRAMUSCULAR
  Filled 2019-09-20: qty 20

## 2019-09-20 MED ORDER — OLANZAPINE 5 MG PO TABS
5.0000 mg | ORAL_TABLET | Freq: Two times a day (BID) | ORAL | Status: DC
  Administered 2019-09-20: 5 mg via ORAL
  Filled 2019-09-20: qty 1

## 2019-09-20 MED ORDER — TRAZODONE HCL 50 MG PO TABS: 50.0000 mg | ORAL_TABLET | Freq: Every day | ORAL | Status: DC

## 2019-09-20 MED ORDER — STERILE WATER FOR INJECTION IJ SOLN
INTRAMUSCULAR | Status: AC
  Administered 2019-09-20: 1.2 mL
  Filled 2019-09-20: qty 10

## 2019-09-20 NOTE — Progress Notes (Signed)
Called back and received report on patient.

## 2019-09-20 NOTE — ED Notes (Signed)
Pt's dinner arrived. °

## 2019-09-20 NOTE — ED Notes (Signed)
Pt accepted to [facility] 502-2 - may be transported at 2100 this evening per Thayer Ohm, [facility].

## 2019-09-20 NOTE — ED Notes (Signed)
Pt noted to be continuing to be manic. Pt standing at nurses' desk - stating he was advised he is being d/c'd. Pt thanking staff multiple times for caring for him. Pt asking for special food "since it's my last day, I need a cheeseburger". Advised pt no special orders - house trays only - and will advise if he is being d/c'd.

## 2019-09-20 NOTE — ED Notes (Signed)
Pt tolerated injection well

## 2019-09-20 NOTE — ED Notes (Signed)
Breakfast Ordered 

## 2019-09-20 NOTE — ED Notes (Addendum)
Pt continues to be manic - talking loudly, cursing, being rude to new Sitter. Pt will not lower voice even w/much encouragement. States he is fine and for the nurse to leave the room. Traded Secondary school teacher d/t pt's behavior.

## 2019-09-20 NOTE — Consult Note (Signed)
Patient accepted at [facility] 502-02.  [facility] requests transfer after 1900hrs.

## 2019-09-20 NOTE — ED Notes (Signed)
Telepsych being performed. 

## 2019-09-20 NOTE — ED Notes (Addendum)
Pt continues to be restless and talking loudly. Pt has not been aggressive w/staff thus far - he was upset when new Sitter came earlier and he voiced he did not want her to sit w/him.

## 2019-09-20 NOTE — ED Notes (Signed)
Pt ambulated in hallway w/Sitter x 2 - returned to room and is lying down at this time.

## 2019-09-20 NOTE — ED Notes (Signed)
Attempted report x 1, no answer 

## 2019-09-20 NOTE — ED Notes (Signed)
Pt states that he doesn't want anything that looks like "jail food". States that he'd rather have [place] sandwiches and sprite. Told pt that I would pass this along to the shift tomorrow, but that we would still order him a tray and he could see what was on it and if there was anything on it that he might like to eat. Pt agreeable to this. Pt still talking about everything and anything. Slight manic behavior. Considering giving him the IM geodon injection if worsened , but pt is cooperative and calm. Pt states that he isn't tired and that he slept all day due to the shot he was given today. Will continue to monitor pt. Pt sitting outside of room at times talking with sitters and RN.

## 2019-09-20 NOTE — ED Provider Notes (Signed)
Emergency Medicine Observation Re-evaluation Note  William Snow is a 29 y.o. male, seen on rounds today.  Pt initially presented to the ED for complaints of Medical Clearance Currently, the patient is sitting at the edge of the bed, rapid speech, mildly agitated.  He has been requiring as needed doses of IM Geodon.  Physical Exam  BP 131/84 (BP Location: Right Arm)   Pulse 88   Temp 98.4 F (36.9 C) (Oral)   Resp 18   SpO2 99%  Physical Exam Vitals and nursing note reviewed.  Constitutional:      General: He is not in acute distress.    Appearance: He is well-developed.     Comments: Sitting at foot of bed, rapid speech  HENT:     Head: Normocephalic and atraumatic.  Eyes:     General:        Right eye: No discharge.        Left eye: No discharge.     Conjunctiva/sclera: Conjunctivae normal.  Neck:     Vascular: No JVD.     Trachea: No tracheal deviation.  Cardiovascular:     Rate and Rhythm: Normal rate.  Pulmonary:     Effort: Pulmonary effort is normal.  Abdominal:     General: There is no distension.  Skin:    Findings: No erythema.  Neurological:     Mental Status: He is alert.  Psychiatric:        Behavior: Behavior is agitated.     ED Course / MDM  EKG:EKG Interpretation  Date/Time:  Thursday September 17 2019 17:23:17 EDT Ventricular Rate:  52 PR Interval:    QRS Duration: 88 QT Interval:  387 QTC Calculation: 360 R Axis:   71 Text Interpretation: no oldSinus rhythm RSR' i n V1 or V2, probably normal variant Borderline T wave abnormalities Borderline ST elevation, anterior leads Confirmed by Pricilla Loveless [phone]) on 09/17/2019 5:49:24 PM Also confirmed by Pricilla Loveless [phone]), editor [place], LaVerne ([id number])  on 09/18/2019 8:57:42 AM    I have reviewed the labs performed to date as well as medications administered while in observation.  Recent changes in the last 24 hours include medication adjustment. Repeat ekg ordered. Plan  Current plan is for  psych placement. Patient is under full IVC at this time.   Jeanie Sewer, PA-C 09/20/19 1140    Jacalyn Lefevre, MD 09/20/19 1213

## 2019-09-20 NOTE — ED Notes (Signed)
Pt ambulating in hallway even after much encouragement to remain in room. Pt approaching staff and other pt's attempting to talk w/them. Pt noted w/excessive speech. Pt attempted to make phone call from desk - advised pt he may make call after shift report. Pt returned to room then came back into hallway.

## 2019-09-20 NOTE — ED Notes (Signed)
Stating he does not like the dinner on the meal tray received and is requesting for pill to be given tonight to help him sleep.
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
## 2019-09-20 NOTE — ED Notes (Addendum)
Pt slept briefly and is now awake eating lunch. Pt had called his mother x 2 this am - aware no more phone calls for the day.

## 2019-09-21 ENCOUNTER — Encounter (HOSPITAL_COMMUNITY): Payer: Self-pay | Admitting: Adult Health

## 2019-09-21 ENCOUNTER — Inpatient Hospital Stay (HOSPITAL_COMMUNITY)
Admission: EM | Admit: 2019-09-21 | Discharge: 2019-09-25 | DRG: 897 | Disposition: A | Discharge status: Home / Self Care | Payer: Federal, State, Local not specified - Other | Source: Intra-hospital | Attending: Psychiatry | Admitting: Psychiatry

## 2019-09-21 ENCOUNTER — Other Ambulatory Visit: Payer: Self-pay

## 2019-09-21 DIAGNOSIS — G4700 Insomnia, unspecified: Secondary | ICD-10-CM | POA: Diagnosis present

## 2019-09-21 DIAGNOSIS — F419 Anxiety disorder, unspecified: Secondary | ICD-10-CM | POA: Diagnosis present

## 2019-09-21 DIAGNOSIS — F17210 Nicotine dependence, cigarettes, uncomplicated: Secondary | ICD-10-CM | POA: Diagnosis present

## 2019-09-21 DIAGNOSIS — R45851 Suicidal ideations: Secondary | ICD-10-CM | POA: Diagnosis present

## 2019-09-21 DIAGNOSIS — F19959 Other psychoactive substance use, unspecified with psychoactive substance-induced psychotic disorder, unspecified: Principal | ICD-10-CM | POA: Diagnosis present

## 2019-09-21 DIAGNOSIS — F1210 Cannabis abuse, uncomplicated: Secondary | ICD-10-CM | POA: Diagnosis present

## 2019-09-21 MED ORDER — LORAZEPAM 1 MG PO TABS
2.0000 mg | ORAL_TABLET | Freq: Four times a day (QID) | ORAL | Status: DC | PRN
  Administered 2019-09-23: 2 mg via ORAL
  Filled 2019-09-21: qty 2

## 2019-09-21 MED ORDER — ZIPRASIDONE MESYLATE 20 MG IM SOLR
20.0000 mg | INTRAMUSCULAR | Status: DC | PRN
Start: 2019-09-21 — End: 2019-09-21

## 2019-09-21 MED ORDER — LORAZEPAM 1 MG PO TABS
1.0000 mg | ORAL_TABLET | ORAL | Status: AC | PRN
  Administered 2019-09-21: 1 mg via ORAL
  Filled 2019-09-21: qty 1

## 2019-09-21 MED ORDER — CARBAMAZEPINE 100 MG PO CHEW
100.0000 mg | CHEWABLE_TABLET | Freq: Two times a day (BID) | ORAL | Status: DC
  Administered 2019-09-21 – 2019-09-22 (×2): 100 mg via ORAL
  Filled 2019-09-21 (×4): qty 1

## 2019-09-21 MED ORDER — ALUM & MAG HYDROXIDE-SIMETH 200-200-20 MG/5ML PO SUSP: 30.0000 mL | ORAL | Status: DC | PRN

## 2019-09-21 MED ORDER — OLANZAPINE 5 MG PO TBDP
15.0000 mg | ORAL_TABLET | Freq: Every day | ORAL | Status: DC
  Administered 2019-09-21: 15 mg via ORAL
  Filled 2019-09-21 (×2): qty 3

## 2019-09-21 MED ORDER — ZIPRASIDONE MESYLATE 20 MG IM SOLR: 20.0000 mg | Freq: Four times a day (QID) | INTRAMUSCULAR | Status: DC | PRN

## 2019-09-21 MED ORDER — OLANZAPINE 10 MG PO TBDP: 10.0000 mg | ORAL_TABLET | Freq: Three times a day (TID) | ORAL | Status: DC | PRN

## 2019-09-21 MED ORDER — ZIPRASIDONE MESYLATE 20 MG IM SOLR: 20.0000 mg | Freq: Two times a day (BID) | INTRAMUSCULAR | Status: DC | PRN

## 2019-09-21 MED ORDER — OLANZAPINE 5 MG PO TABS
5.0000 mg | ORAL_TABLET | Freq: Every day | ORAL | Status: DC
  Administered 2019-09-22: 5 mg via ORAL
  Filled 2019-09-21 (×3): qty 1

## 2019-09-21 MED ORDER — LORAZEPAM 1 MG PO TABS: 1.0000 mg | ORAL_TABLET | ORAL | Status: DC | PRN

## 2019-09-21 MED ORDER — OLANZAPINE 5 MG PO TABS
5.0000 mg | ORAL_TABLET | Freq: Two times a day (BID) | ORAL | Status: DC
  Administered 2019-09-21: 5 mg via ORAL
  Filled 2019-09-21 (×3): qty 1

## 2019-09-21 MED ORDER — TRAZODONE HCL 50 MG PO TABS
50.0000 mg | ORAL_TABLET | Freq: Every day | ORAL | Status: DC
  Administered 2019-09-21 – 2019-09-24 (×5): 50 mg via ORAL
  Filled 2019-09-21 (×2): qty 1
  Filled 2019-09-21: qty 7
  Filled 2019-09-21 (×6): qty 1

## 2019-09-21 MED ORDER — ACETAMINOPHEN 325 MG PO TABS: 650.0000 mg | ORAL_TABLET | Freq: Four times a day (QID) | ORAL | Status: DC | PRN

## 2019-09-21 MED ORDER — MAGNESIUM HYDROXIDE 400 MG/5ML PO SUSP: 30.0000 mL | Freq: Every day | ORAL | Status: DC | PRN

## 2019-09-21 MED ORDER — OLANZAPINE 10 MG PO TBDP
10.0000 mg | ORAL_TABLET | Freq: Three times a day (TID) | ORAL | Status: DC | PRN
  Administered 2019-09-22 – 2019-09-24 (×2): 10 mg via ORAL
  Filled 2019-09-21 (×2): qty 1

## 2019-09-21 NOTE — [facility] Suicide Risk Assessment (Signed)
[facility] INPATIENT:  Family/Significant Other Suicide Prevention Education   Suicide Prevention Education: Education Completed; Mother, William [facility] [phone]), has been identified by the patient as the family member/significant other with whom the patient will be residing, and identified as the person(s) who will aid the patient in the event of a mental health crisis (suicidal ideations/suicide attempt).  With written consent from the patient, the family member/significant other has been provided the following suicide prevention education, prior to the and/or following the discharge of the patient.  The suicide prevention education provided includes the following:  Suicide risk factors  Suicide prevention and interventions  National Suicide Hotline telephone number  [facility] assessment telephone number  [facility] Emergency Assistance 911  [facility] and/or Residential Mobile Crisis Unit telephone number   Request made of family/significant other to:  Remove weapons (e.g., guns, rifles, knives), all items previously/currently identified as safety concern.    Remove drugs/medications (over-the-counter, prescriptions, illicit drugs), all items previously/currently identified as a safety concern.   The family member/significant other verbalizes understanding of the suicide prevention education information provided.  The family member/significant other agrees to remove the items of safety concern listed above.  CSW spoke with patients mother who stated that this patient is able to return home at discharge. Pt's mother stated that this patient took his son and would not tell anyone where he was. Pt's son's mother called CPS due to patient not telling her where he was with their son. CPS was unable to locate or get in contact with this pt and was finally found when he called his son;s mother to pay for his hotel room. Patient's family is very supportive of him and  want to ensure that a similar scenario does not happen again.   [facility] MSW, [employer] Clincal Social Worker  [facility]

## 2019-09-21 NOTE — Progress Notes (Signed)
Recreation Therapy Notes  Date: 6.28.21 Time: 1000 Location: 500 Hall Dayroom  Group Topic: Coping Skills  Goal Area(s) Addresses:  Patient will be able to identify positive coping skills. Patient will be able to identify benefit of using coping skills post d/c.  Behavioral Response: Engaged  Intervention: Pencils, Blank mind map, Board, Dry erase markers  Activity: Mind Map.  LRT and patients filled in the first 8 boxes (anger, communication, sadness, depression, finances, relationships, manic and irritable) together.  Patients were then given time to come up with at least 3 coping skills for each area identified.  Coping skills would then be written on the board next to the corresponding issue.  Education: Pharmacologist, Building control surveyor.   Education Outcome: Acknowledges understanding/In group clarification offered/Needs additional education.   Clinical Observations/Feedback: Pt was engaged and talkative but could be redirected.  Pt was pleasant and identified some coping skills as respect, healthy eating, connecting with nature, cooking, trust and medicinal marijuana.        William [facility], LRT/CTRS     [facility] A 09/21/2019 11:46 AM

## 2019-09-21 NOTE — Progress Notes (Signed)
Pt continues to be paranoid, hyper verbal, circumstantial,  but redirectable on the unit.     09/21/19 2200  Psych Admission Type (Psych Patients Only)  Admission Status Involuntary  Psychosocial Assessment  Patient Complaints Anxiety  Eye Contact Fair  Facial Expression Animated;Anxious;Wide-eyed  Affect Anxious  Speech Logical/coherent;Pressured;Tangential  Interaction Assertive  Motor Activity Restless  Appearance/Hygiene Unremarkable  Behavior Characteristics Cooperative;Anxious;Guarded  Mood Preoccupied  Thought Process  Coherency Circumstantial  Content Preoccupation  Delusions None reported or observed  Perception WDL  Hallucination None reported or observed  Judgment Limited  Confusion Mild  Danger to Self  Current suicidal ideation? Denies  Danger to Others  Danger to Others None reported or observed

## 2019-09-21 NOTE — ED Notes (Signed)
Pt. Made aware that he is being transported to a [facility] facility. Pt. Very cooperative, contacted mother prior to transport.

## 2019-09-21 NOTE — [facility] Counselor (Signed)
CSW briefly spoke with patient's mother, [name] [phone]). Mother reports patient was getting agitated with her on the phone demanding to be discharged and asking her to advocate on his behalf. Mother sounded very cautious on the phone and stated "I want to make sure he is ready to come home when the time comes." Mother sounded relieved when she was informed that patient would not be discharged today.  Enid Cutter, MSW, LCSW-A Clinical Social Worker [facility]

## 2019-09-21 NOTE — [facility] Group Notes (Signed)
The focus of this group is to help patients establish daily goals to achieve during treatment and discuss how the patient can incorporate goal setting into their daily lives to aide in recovery.  Pt stated that he wants to try and make it home today. Pt has no insight into why he is here. Pt is very talkative.
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
## 2019-09-21 NOTE — H&P (Signed)
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  Patient is a 29 year old male who originally presented on 09/17/2019 to the [facility] emergency department under involuntary commitment.  This was reportedly for increasing agitation and bizarre behavior.  The patient was reportedly talking to cars.  I report from the patient's brother stated that the patient had gone somewhere last week, and had not been himself since he had returned.  He is pressured throughout the interview, tangential and difficult to address.  He stated that "I am fine now, I talk to my mother and everything is good".  He stated that he is currently in a custody battle with the mother of his child for some unspecified reason.  He stated the only drug he had been using recently was marijuana.  He stated in the past that he had been at [facility], and it was because he was "taking cocaine".  He stated he was clean of that hand and had not used.  He stated he was taking medications while he was on the inpatient service, but he did not continue any medications after discharge.  There are notes in the chart that also revealed that he was having delusional thinking about his mother doing witchcraft, but he was resistant to answering these questions to her evaluation.  On admission that he was recommended inpatient treatment and starting Zyprexa 5 mg p.o. nightly for psychosis.  While he was in the emergency department on 6/26 he had increased agitation and had to receive Geodon 10 mg IM.  There is also consideration of giving Geodon on several occasions secondary to increased agitation while  waiting in the emergency room.  He was transferred to our facility on 09/20/2019.  Associated Signs/Symptoms: Depression Symptoms:  anhedonia, insomnia, psychomotor agitation, fatigue, feelings of worthlessness/guilt, difficulty concentrating, hopelessness, suicidal thoughts without plan, anxiety, loss of energy/fatigue, disturbed sleep, (Hypo) Manic Symptoms:  Impulsivity, Irritable Mood, Labiality of Mood, Anxiety Symptoms:  Excessive Worry, Psychotic Symptoms:  Delusions, Paranoia, PTSD Symptoms: Negative Total Time spent with patient: 45 minutes  Past Psychiatric History: Patient reported a previous psychiatric hospitalization at [facility] sometime in the past.  He had been "taking cocaine" at that time.  He could not remember any medication she took while he was in the hospital, but after discharge she did not comply with any medications.  Is the patient at risk to self? Yes.    Has the patient been a risk to self in the past 6 months? No.  Has the patient been a risk to self within the distant past? No.  Is the patient a risk to others? Yes.    Has the patient been a risk to others in the past 6 months? No.  Has the patient been a risk to others within the distant past? No.   Prior Inpatient Therapy:   Prior Outpatient Therapy:    Alcohol Screening: Patient refused Alcohol Screening Tool: Yes 1. How often do you have a drink containing alcohol?: 2 to 4 times a month 2. How many drinks containing alcohol do you have on a typical day when you are drinking?: 3 or 4 3. How often do you have six or more drinks on one occasion?: Never AUDIT-C Score: 3 4.  How often during the last year have you found that you were not able to stop drinking once you had started?: Never 5. How often during the last year have you failed to do what was normally expected from you because of drinking?: Never 6. How often during the last year have you needed a first drink in the morning to get  yourself going after a heavy drinking session?: Never 7. How often during the last year have you had a feeling of guilt of remorse after drinking?: Never 8. How often during the last year have you been unable to remember what happened the night before because you had been drinking?: Never 9. Have you or someone else been injured as a result of your drinking?: No 10. Has a relative or friend or a doctor or another health worker been concerned about your drinking or suggested you cut down?: No Alcohol Use Disorder Identification Test Final Score (AUDIT): 3 Alcohol Brief Interventions/Follow-up: AUDIT Score <7 follow-up not indicated Substance Abuse History in the last 12 months:  Yes.   Consequences of Substance Abuse: Medical Consequences:  Most likely contributing to psychosis currently. Previous Psychotropic Medications: Yes  Psychological Evaluations: No  Past Medical History: History reviewed. No pertinent past medical history. History reviewed. No pertinent surgical history. Family History: History reviewed. No pertinent family history. Family Psychiatric  History: Denied Tobacco Screening: Have you used any form of tobacco in the last 30 days? (Cigarettes, Smokeless Tobacco, Cigars, and/or Pipes): Yes Tobacco use, Select all that apply: 5 or more cigarettes per day Are you interested in Tobacco Cessation Medications?: No, patient refused Counseled patient on smoking cessation including recognizing danger situations, developing coping skills and basic information about quitting provided: Refused/Declined practical counseling Social History:  Social History   Substance and Sexual Activity  Alcohol Use Yes     Social History   Substance and Sexual Activity  Drug Use Yes  . Types: Marijuana   Comment: pt  reports 28 grams daily    Additional Social History: Marital status: Single Are you sexually active?: Yes What is your sexual orientation?: Heterosexual Has your sexual activity  been affected by drugs, alcohol, medication, or emotional stress?: No Does patient have children?: Yes How many children?: 1 How is patient's relationship with their children?: 2y.o. son, "We do everything together"                         Allergies:  No Known Allergies Lab Results:  Results for orders placed or performed during the hospital encounter of 09/17/19 (from the past 48 hour(s))  CBG monitoring, ED     Status: None   Collection Time: 09/20/19 12:01 AM  Result Value Ref Range   Glucose-Capillary 83 70 - 99 mg/dL    Comment: Glucose reference range applies only to samples taken after fasting for at least 8 hours.    Blood Alcohol level:  Lab Results  Component Value Date   ETH <10 09/17/2019    Metabolic Disorder Labs:  No results found for: HGBA1C, MPG No results found for: PROLACTIN No results found for: CHOL, TRIG, HDL, CHOLHDL, VLDL, LDLCALC  Current Medications: Current Facility-Administered Medications  Medication Dose Route Frequency Provider Last Rate Last Admin  . acetaminophen (TYLENOL) tablet 650 mg  650 mg Oral Q6H PRN Ophelia Shoulder E, NP      . alum & mag hydroxide-simeth (MAALOX/MYLANTA) 200-200-20 MG/5ML suspension 30 mL  30 mL Oral Q4H PRN Ophelia Shoulder  E, NP      . carbamazepine (TEGRETOL) chewable tablet 100 mg  100 mg Oral BID Antonieta Pert, MD      . ziprasidone (GEODON) injection 20 mg  20 mg Intramuscular Q6H PRN Antonieta Pert, MD       And  . OLANZapine zydis (ZYPREXA) disintegrating tablet 10 mg  10 mg Oral Q8H PRN Antonieta Pert, MD       And  . LORazepam (ATIVAN) tablet 2 mg  2 mg Oral Q6H PRN Antonieta Pert, MD      . magnesium hydroxide (MILK OF MAGNESIA) suspension 30 mL  30 mL Oral Daily PRN Chales Abrahams, NP      . Melene Muller ON 09/22/2019] OLANZapine (ZYPREXA) tablet 5 mg  5 mg Oral Daily Antonieta Pert, MD      . OLANZapine zydis (ZYPREXA) disintegrating tablet 15 mg  15 mg Oral QHS Antonieta Pert, MD       . traZODone (DESYREL) tablet 50 mg  50 mg Oral QHS Ophelia Shoulder E, NP   50 mg at 09/21/19 0055   PTA Medications: Medications Prior to Admission  Medication Sig Dispense Refill Last Dose  . traMADol (ULTRAM) 50 MG tablet Take 1 tablet (50 mg total) by mouth every 6 (six) hours as needed. (Patient not taking: Reported on 09/17/2019) 15 tablet 0 Unknown at Unknown time    Musculoskeletal: Strength & Muscle Tone: within normal limits Gait & Station: normal Patient leans: N/A  Psychiatric Specialty Exam: Physical Exam Vitals and nursing note reviewed.  Constitutional:      Appearance: Normal appearance.  HENT:     Head: Normocephalic and atraumatic.  Pulmonary:     Effort: Pulmonary effort is normal.  Neurological:     General: No focal deficit present.     Mental Status: He is alert and oriented to person, place, and time.     Review of Systems  Blood pressure (!) 144/88, pulse 93, temperature 97.8 F (36.6 C), temperature source Oral, resp. rate 18, height 6\' 9"  (2.057 m), weight 95.3 kg, SpO2 100 %.Body mass index is 22.5 kg/m.  General Appearance: Casual  Eye Contact:  Fair  Speech:  Pressured  Volume:  Increased  Mood:  Anxious, Dysphoric and Irritable  Affect:  Labile  Thought Process:  Goal Directed and Descriptions of Associations: Tangential  Orientation:  Full (Time, Place, and Person)  Thought Content:  Delusions and Tangential  Suicidal Thoughts:  No  Homicidal Thoughts:  No  Memory:  Immediate;   Poor Recent;   Poor Remote;   Poor  Judgement:  Impaired  Insight:  Lacking  Psychomotor Activity:  Increased  Concentration:  Concentration: Fair and Attention Span: Fair  Recall:  of Knowledge:  Fair  Language:  Good  Akathisia:  Negative  Handed:  Right  AIMS (if indicated):     Assets:  Desire for Improvement Resilience  ADL's:  Intact  Cognition:  WNL  Sleep:  Number of Hours: 4.25    Treatment Plan Summary: Daily contact with  patient to assess and evaluate symptoms and progress in treatment, Medication management and Plan : Patient is seen and examined.  Patient is a 29 year old male with the above-stated past psychiatric history who is admitted secondary to psychosis and possible bipolar disorder.  He will be admitted to the hospital.  He will be integrated in the milieu.  He will be encouraged to attend groups.  He received Zyprexa  5 mg p.o. twice daily per admit notes.  I have added the agitation protocol.  I will increase his Zyprexa to 5 mg p.o. daily and 10 mg p.o. nightly.  He will also have available hydroxyzine as well as trazodone for sleep if necessary.  Review of his laboratories revealed essentially normal electrolytes.  Liver function enzymes were not done.  CBC with differential was normal.  Acetaminophen was less than 10.  Blood alcohol was less than 10.  Drug screen was positive for marijuana.  EKG showed a sinus rhythm with a normal QTc interval.  Review of the electronic medical record revealed no previous psychiatric evaluations for what was in the chart.  We will need to get collateral information for him.  Observation Level/Precautions:  15 minute checks  Laboratory:  Chemistry Profile  Psychotherapy:    Medications:    Consultations:    Discharge Concerns:    Estimated LOS:  Other:     Physician Treatment Plan for Primary Diagnosis: <principal problem not specified> Long Term Goal(s): Improvement in symptoms so as ready for discharge  Short Term Goals: Ability to identify changes in lifestyle to reduce recurrence of condition will improve, Ability to verbalize feelings will improve, Ability to demonstrate self-control will improve, Ability to identify and develop effective coping behaviors will improve, Ability to maintain clinical measurements within normal limits will improve and Ability to identify triggers associated with substance abuse/mental health issues will improve  Physician Treatment  Plan for Secondary Diagnosis: Active Problems:   Psychoactive substance-induced psychosis (HCC)  Long Term Goal(s): Improvement in symptoms so as ready for discharge  Short Term Goals: Ability to identify changes in lifestyle to reduce recurrence of condition will improve, Ability to verbalize feelings will improve, Ability to demonstrate self-control will improve, Ability to identify and develop effective coping behaviors will improve, Ability to maintain clinical measurements within normal limits will improve and Ability to identify triggers associated with substance abuse/mental health issues will improve  I certify that inpatient services furnished can reasonably be expected to improve the patient's condition.    Antonieta Pert, MD 6/28/20213:26 PM
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
## 2019-09-21 NOTE — Tx Team (Addendum)
Initial Treatment Plan 09/21/2019 4:11 AM [name] GOV:[id number]    PATIENT STRESSORS: Marital or family conflict Substance abuse   PATIENT STRENGTHS: Active sense of humor Average or above average intelligence Motivation for treatment/growth   PATIENT IDENTIFIED PROBLEMS: Substance abuse      " I don't need a goal because I'm leaving tomorrow."               DISCHARGE CRITERIA:  Improved stabilization in mood, thinking, and/or behavior  PRELIMINARY DISCHARGE PLAN: Return to previous living arrangement  PATIENT/FAMILY INVOLVEMENT: This treatment plan has been presented to and reviewed with the patient, William Snow, and/or family member.  The patient and family have been given the opportunity to ask questions and make suggestions.  Floyce Stakes, RN 09/21/2019, 4:11 AM

## 2019-09-21 NOTE — [facility] Suicide Risk Assessment (Signed)
[facility] Admission Suicide Risk Assessment   Nursing information obtained from:  Patient Demographic factors:  Male, Adolescent or young adult Current Mental Status:  NA Loss Factors:  Legal issues Historical Factors:  NA Risk Reduction Factors:  Living with another person, especially a relative  Total Time spent with patient: 45 minutes Principal Problem: <principal problem not specified> Diagnosis:  Active Problems:   Psychoactive substance-induced psychosis (H[facility])  Subjective Data: Patient is seen and examined.  Patient is a 29 year old male who originally presented on 09/17/2019 to the [facility] emergency department under involuntary commitment.  This was reportedly for increasing agitation and bizarre behavior.  The patient was reportedly talking to cars.  I report from the patient's brother stated that the patient had gone somewhere last week, and had not been himself since he had returned.  He is pressured throughout the interview, tangential and difficult to address.  He stated that "I am fine now, I talk to my mother and everything is good".  He stated that he is currently in a custody battle with the mother of his child for some unspecified reason.  He stated the only drug he had been using recently was marijuana.  He stated in the past that he had been at [facility], and it was because he was "taking cocaine".  He stated he was clean of that hand and had not used.  He stated he was taking medications while he was on the inpatient service, but he did not continue any medications after discharge.  There are notes in the chart that also revealed that he was having delusional thinking about his mother doing witchcraft, but he was resistant to answering these questions to her evaluation.  On admission that he was recommended inpatient treatment and starting Zyprexa 5 mg p.o. nightly for psychosis.  While he was in the emergency department on 6/26 he had increased agitation and had to receive  Geodon 10 mg IM.  There is also consideration of giving Geodon on several occasions secondary to increased agitation while waiting in the emergency room.  He was transferred to our facility on 09/20/2019.  Continued Clinical Symptoms:  Alcohol Use Disorder Identification Test Final Score (AUDIT): 3 The "Alcohol Use Disorders Identification Test", Guidelines for Use in Primary Care, Second Edition.  World Science writer [facility]). Score between 0-7:  no or low risk or alcohol related problems. Score between 8-15:  moderate risk of alcohol related problems. Score between 16-19:  high risk of alcohol related problems. Score 20 or above:  warrants further diagnostic evaluation for alcohol dependence and treatment.   CLINICAL FACTORS:   Alcohol/Substance Abuse/Dependencies Currently Psychotic Previous Psychiatric Diagnoses and Treatments   Musculoskeletal: Strength & Muscle Tone: within normal limits Gait & Station: normal Patient leans: N/A  Psychiatric Specialty Exam: Physical Exam Vitals and nursing note reviewed.  Constitutional:      Appearance: Normal appearance.  HENT:     Head: Normocephalic and atraumatic.  Pulmonary:     Effort: Pulmonary effort is normal.  Neurological:     General: No focal deficit present.     Mental Status: He is alert and oriented to person, place, and time.     Review of Systems  Blood pressure (!) 144/88, pulse 93, temperature 97.8 F (36.6 C), temperature source Oral, resp. rate 18, height 6\' 9"  (2.057 m), weight 95.3 kg, SpO2 100 %.Body mass index is 22.5 kg/m.  General Appearance: Casual  Eye Contact:  Good  Speech:  Pressured  Volume:  Increased  Mood:  Dysphoric, Euphoric and Irritable  Affect:  Labile  Thought Process:  Disorganized and Descriptions of Associations: Tangential  Orientation:  Full (Time, Place, and Person)  Thought Content:  Delusions, Paranoid Ideation, Rumination and Tangential  Suicidal Thoughts:  No  Homicidal  Thoughts:  No  Memory:  Immediate;   Poor Recent;   Poor Remote;   Poor  Judgement:  Impaired  Insight:  Lacking  Psychomotor Activity:  Increased  Concentration:  Concentration: Poor and Attention Span: Poor  Recall:  Poor  Fund of Knowledge:  Fair  Language:  Good  Akathisia:  Negative  Handed:  Right  AIMS (if indicated):     Assets:  Desire for Improvement Resilience  ADL's:  Intact  Cognition:  WNL  Sleep:  Number of Hours: 4.25      COGNITIVE FEATURES THAT CONTRIBUTE TO RISK:  Thought constriction (tunnel vision)    SUICIDE RISK:   Mild:  Suicidal ideation of limited frequency, intensity, duration, and specificity.  There are no identifiable plans, no associated intent, mild dysphoria and related symptoms, good self-control (both objective and subjective assessment), few other risk factors, and identifiable protective factors, including available and accessible social support.  PLAN OF CARE: Patient is seen and examined.  Patient is a 29 year old male with the above-stated past psychiatric history who is admitted secondary to psychosis and possible bipolar disorder.  He will be admitted to the hospital.  He will be integrated in the milieu.  He will be encouraged to attend groups.  He received Zyprexa 5 mg p.o. twice daily per admit notes.  I have added the agitation protocol.  I will increase his Zyprexa to 5 mg p.o. daily and 10 mg p.o. nightly.  He will also have available hydroxyzine as well as trazodone for sleep if necessary.  Review of his laboratories revealed essentially normal electrolytes.  Liver function enzymes were not done.  CBC with differential was normal.  Acetaminophen was less than 10.  Blood alcohol was less than 10.  Drug screen was positive for marijuana.  EKG showed a sinus rhythm with a normal QTc interval.  Review of the electronic medical record revealed no previous psychiatric evaluations for what was in the chart.  We will need to get collateral  information for him.  I certify that inpatient services furnished can reasonably be expected to improve the patient's condition.   Antonieta Pert, MD 09/21/2019, 11:16 AM

## 2019-09-21 NOTE — Tx Team (Signed)
Interdisciplinary Treatment and Diagnostic Plan Update  09/21/2019 Time of Session: 9:00am William Snow MRN: [id number]  Principal Diagnosis: <principal problem not specified>  Secondary Diagnoses: Active Problems:   Psychoactive substance-induced psychosis ([place])   Current Medications:  Current Facility-Administered Medications  Medication Dose Route Frequency Provider Last Rate Last Admin   acetaminophen (TYLENOL) tablet 650 mg  650 mg Oral Q6H PRN Mallie Darting, NP       alum & mag hydroxide-simeth (MAALOX/MYLANTA) 200-200-20 MG/5ML suspension 30 mL  30 mL Oral Q4H PRN Merlyn Lot E, NP       ziprasidone (GEODON) injection 20 mg  20 mg Intramuscular Q6H PRN Sharma Covert, MD       And   OLANZapine zydis (ZYPREXA) disintegrating tablet 10 mg  10 mg Oral Q8H PRN Sharma Covert, MD       And   LORazepam (ATIVAN) tablet 2 mg  2 mg Oral Q6H PRN Sharma Covert, MD       magnesium hydroxide (MILK OF MAGNESIA) suspension 30 mL  30 mL Oral Daily PRN Mallie Darting, NP       OLANZapine (ZYPREXA) tablet 5 mg  5 mg Oral BID Merlyn Lot E, NP   5 mg at 09/21/19 0816   traZODone (DESYREL) tablet 50 mg  50 mg Oral QHS Merlyn Lot E, NP   50 mg at 09/21/19 0055   PTA Medications: Medications Prior to Admission  Medication Sig Dispense Refill Last Dose   traMADol (ULTRAM) 50 MG tablet Take 1 tablet (50 mg total) by mouth every 6 (six) hours as needed. (Patient not taking: Reported on 09/17/2019) 15 tablet 0 Unknown at Unknown time    Patient Stressors: Marital or family conflict Substance abuse  Patient Strengths: Active sense of humor Average or above average intelligence Motivation for treatment/growth  Treatment Modalities: Medication Management, Group therapy, Case management,  1 to 1 session with clinician, Psychoeducation, Recreational therapy.   Physician Treatment Plan for Primary Diagnosis: <principal problem not specified> Long Term Goal(s):      Short Term Goals:    Medication Management: Evaluate patient's response, side effects, and tolerance of medication regimen.  Therapeutic Interventions: 1 to 1 sessions, Unit Group sessions and Medication administration.  Evaluation of Outcomes: Not Met  Physician Treatment Plan for Secondary Diagnosis: Active Problems:   Psychoactive substance-induced psychosis ([place])  Long Term Goal(s):     Short Term Goals:       Medication Management: Evaluate patient's response, side effects, and tolerance of medication regimen.  Therapeutic Interventions: 1 to 1 sessions, Unit Group sessions and Medication administration.  Evaluation of Outcomes: Not Met   RN Treatment Plan for Primary Diagnosis: <principal problem not specified> Long Term Goal(s): Knowledge of disease and therapeutic regimen to maintain health will improve  Short Term Goals: Ability to demonstrate self-control, Ability to disclose and discuss suicidal ideas and Ability to identify and develop effective coping behaviors will improve  Medication Management: RN will administer medications as ordered by provider, will assess and evaluate patient's response and provide education to patient for prescribed medication. RN will report any adverse and/or side effects to prescribing provider.  Therapeutic Interventions: 1 on 1 counseling sessions, Psychoeducation, Medication administration, Evaluate responses to treatment, Monitor vital signs and CBGs as ordered, Perform/monitor CIWA, COWS, AIMS and Fall Risk screenings as ordered, Perform wound care treatments as ordered.  Evaluation of Outcomes: Not Met   LCSW Treatment Plan for Primary Diagnosis: <principal problem not specified> Long Term  Goal(s): Safe transition to appropriate next level of care at discharge, Engage patient in therapeutic group addressing interpersonal concerns.  Short Term Goals: Engage patient in aftercare planning with referrals and resources, Increase  social support, Increase emotional regulation, Identify triggers associated with mental health/substance abuse issues and Increase skills for wellness and recovery  Therapeutic Interventions: Assess for all discharge needs, 1 to 1 time with Social worker, Explore available resources and support systems, Assess for adequacy in community support network, Educate family and significant other(s) on suicide prevention, Complete Psychosocial Assessment, Interpersonal group therapy.  Evaluation of Outcomes: Not Met  Progress in Treatment: Attending groups: No. New to unit. Participating in groups: No. Taking medication as prescribed: Yes. Toleration medication: Yes. Family/Significant other contact made: No, will contact:  supports if consents are granted. Patient understands diagnosis: No. Discussing patient identified problems/goals with staff: No. Medical problems stabilized or resolved: Yes. Denies suicidal/homicidal ideation: No. Issues/concerns per patient self-inventory: No.  New problem(s) identified: No, Describe:  CSW assessing for appropriate referrals  New Short Term/Long Term Goal(s): detox, medication management for mood stabilization; elimination of SI thoughts; development of comprehensive mental wellness/sobriety plan.  Patient Goals: "Make it home"  Discharge Plan or Barriers: Patient recently admitted to unit, CSW assessing for appropriate referrals.   Reason for Continuation of Hospitalization: Anxiety Delusions  Medication stabilization  Estimated Length of Stay: 5-7 days  Attendees: Patient: William Snow 09/21/2019 11:23 AM  Physician: Dr. Mallie Darting 09/21/2019 11:23 AM  Nursing:  09/21/2019 11:23 AM  RN Care Manager: 09/21/2019 11:23 AM  Social Worker: Stephanie Acre, [place] 09/21/2019 11:23 AM  Recreational Therapist:  09/21/2019 11:23 AM  Other:  09/21/2019 11:23 AM  Other:  09/21/2019 11:23 AM  Other: 09/21/2019 11:23 AM    Scribe for Treatment Team: Joellen Jersey,  [place] 09/21/2019 11:23 AM
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
## 2019-09-22 LAB — HEPATIC FUNCTION PANEL
ALT: 26 U/L (ref 0–44)
AST: 31 U/L (ref 15–41)
Albumin: 4.5 g/dL (ref 3.5–5.0)
Alkaline Phosphatase: 60 U/L (ref 38–126)
Bilirubin, Direct: 0.1 mg/dL (ref 0.0–0.2)
Indirect Bilirubin: 0.4 mg/dL (ref 0.3–0.9)
Total Bilirubin: 0.5 mg/dL (ref 0.3–1.2)
Total Protein: 7.2 g/dL (ref 6.5–8.1)

## 2019-09-22 LAB — TSH: TSH: 1.539 u[IU]/mL (ref 0.350–4.500)

## 2019-09-22 MED ORDER — OLANZAPINE 10 MG PO TABS
10.0000 mg | ORAL_TABLET | Freq: Every day | ORAL | Status: DC
  Administered 2019-09-23: 10 mg via ORAL
  Filled 2019-09-22 (×2): qty 1

## 2019-09-22 MED ORDER — CARBAMAZEPINE 100 MG PO CHEW
200.0000 mg | CHEWABLE_TABLET | Freq: Two times a day (BID) | ORAL | Status: DC
  Administered 2019-09-22 – 2019-09-25 (×6): 200 mg via ORAL
  Filled 2019-09-22: qty 28
  Filled 2019-09-22 (×8): qty 2
  Filled 2019-09-22: qty 28
  Filled 2019-09-22: qty 2

## 2019-09-22 MED ORDER — OLANZAPINE 10 MG PO TBDP
20.0000 mg | ORAL_TABLET | Freq: Every day | ORAL | Status: DC
  Administered 2019-09-22 – 2019-09-24 (×3): 20 mg via ORAL
  Filled 2019-09-22 (×4): qty 2
  Filled 2019-09-22: qty 14

## 2019-09-22 NOTE — Progress Notes (Signed)
Pt stated he thought the Zyprexa was a little strong. Pt encouraged to talk to the doctor about it.

## 2019-09-22 NOTE — Progress Notes (Signed)
Pt less intrusive, pt appropriate on the unit, pt pleasant    09/22/19 2000  Psych Admission Type (Psych Patients Only)  Admission Status Involuntary  Psychosocial Assessment  Patient Complaints Anxiety  Eye Contact Fair  Facial Expression Animated;Anxious;Wide-eyed  Affect Anxious  Speech Logical/coherent;Pressured;Tangential  Interaction Assertive  Motor Activity Restless  Appearance/Hygiene Unremarkable  Behavior Characteristics Cooperative  Mood Pleasant;Preoccupied  Thought Process  Coherency Circumstantial  Content Preoccupation  Delusions None reported or observed  Perception WDL  Hallucination None reported or observed  Judgment Limited  Confusion Mild  Danger to Self  Current suicidal ideation? Denies  Danger to Others  Danger to Others None reported or observed

## 2019-09-22 NOTE — Progress Notes (Signed)
Patient states that he enjoyed his day and was grateful for the staff's assistance. He is complaining that the medicine is too strong for him. His goal for tomorrow is to be with his child.

## 2019-09-22 NOTE — Progress Notes (Signed)
   09/22/19 0900  Psych Admission Type (Psych Patients Only)  Admission Status Involuntary  Psychosocial Assessment  Patient Complaints Anxiety  Eye Contact Fair  Facial Expression Animated;Anxious;Wide-eyed  Affect Anxious  Speech Logical/coherent;Pressured;Tangential  Interaction Assertive  Motor Activity Restless  Appearance/Hygiene Unremarkable  Behavior Characteristics Cooperative  Mood Preoccupied  Thought Process  Coherency Circumstantial  Content Preoccupation  Delusions None reported or observed  Perception WDL  Hallucination None reported or observed  Judgment Limited  Confusion Mild  Danger to Self  Current suicidal ideation? Denies  Danger to Others  Danger to Others None reported or observed

## 2019-09-22 NOTE — Progress Notes (Signed)
[facility] MD Progress Note  09/22/2019 10:55 AM Knoxx Boeding  MRN:  [id number] Subjective: Patient is a 29 year old male admitted on 96/24/21 secondary to increasing agitation and bizarre behavior.  The patient was reportedly talking to cars, and his brother reported that he had gone somewhere last week and had not been himself since he returned.  Objective: Patient is seen and examined.  Patient is a 29 year old male with the above-stated past psychiatric history is seen in follow-up.  Yesterday he had pressured speech, intrusiveness, tangentiality throughout the day.  He received Zyprexa 15 mg p.o. nightly.  He also received 5 mg p.o. daily.  He was started on Tegretol 200 mg p.o. twice daily.  Today his speech rate has decreased significantly.  He still is a bit intrusive but certainly not as bad as yesterday.  His vital signs are stable, he is afebrile.  He slept 6.5 hours last night.  As stated above, he is much less pressured than yesterday, not as tangential, not euphoric.  Review of his laboratories revealed normal liver function enzymes.  His TSH was 1.539.  No other new laboratories.  Principal Problem: <principal problem not specified> Diagnosis: Active Problems:   Psychoactive substance-induced psychosis ([facility])  Total Time spent with patient: 20 minutes  Past Psychiatric History: See admission H&P  Past Medical History: History reviewed. No pertinent past medical history. History reviewed. No pertinent surgical history. Family History: History reviewed. No pertinent family history. Family Psychiatric  History: See admission H&P Social History:  Social History   Substance and Sexual Activity  Alcohol Use Yes     Social History   Substance and Sexual Activity  Drug Use Yes  . Types: Marijuana   Comment: pt  reports 28 grams daily    Social History   Socioeconomic History  . Marital status: Single    Spouse name: Not on file  . Number of children: Not on file  . Years of  education: Not on file  . Highest education level: Not on file  Occupational History  . Not on file  Tobacco Use  . Smoking status: Current Every Day Smoker  . Smokeless tobacco: Never Used  Substance and Sexual Activity  . Alcohol use: Yes  . Drug use: Yes    Types: Marijuana    Comment: pt  reports 28 grams daily  . Sexual activity: Not on file  Other Topics Concern  . Not on file  Social History Narrative  . Not on file   Social Determinants of Health   Financial Resource Strain:   . Difficulty of Paying Living Expenses:   Food Insecurity:   . Worried About Programme researcher, broadcasting/film/video in the Last Year:   . Barista in the Last Year:   Transportation Needs:   . Freight forwarder (Medical):   Marland Kitchen Lack of Transportation (Non-Medical):   Physical Activity:   . Days of Exercise per Week:   . Minutes of Exercise per Session:   Stress:   . Feeling of Stress :   Social Connections:   . Frequency of Communication with Friends and Family:   . Frequency of Social Gatherings with Friends and Family:   . Attends Religious Services:   . Active Member of Clubs or Organizations:   . Attends Banker Meetings:   Marland Kitchen Marital Status:    Additional Social History:  Sleep: Fair  Appetite:  Good  Current Medications: Current Facility-Administered Medications  Medication Dose Route Frequency Provider Last Rate Last Admin  . acetaminophen (TYLENOL) tablet 650 mg  650 mg Oral Q6H PRN Ophelia Shoulder E, NP      . alum & mag hydroxide-simeth (MAALOX/MYLANTA) 200-200-20 MG/5ML suspension 30 mL  30 mL Oral Q4H PRN Ophelia Shoulder E, NP      . carbamazepine (TEGRETOL) chewable tablet 200 mg  200 mg Oral BID Antonieta Pert, MD      . ziprasidone (GEODON) injection 20 mg  20 mg Intramuscular Q6H PRN Antonieta Pert, MD       And  . OLANZapine zydis (ZYPREXA) disintegrating tablet 10 mg  10 mg Oral Q8H PRN Antonieta Pert, MD       And  .  LORazepam (ATIVAN) tablet 2 mg  2 mg Oral Q6H PRN Antonieta Pert, MD      . magnesium hydroxide (MILK OF MAGNESIA) suspension 30 mL  30 mL Oral Daily PRN Ophelia Shoulder E, NP      . OLANZapine (ZYPREXA) tablet 5 mg  5 mg Oral Daily Antonieta Pert, MD   5 mg at 09/22/19 0747  . OLANZapine zydis (ZYPREXA) disintegrating tablet 20 mg  20 mg Oral QHS Antonieta Pert, MD      . traZODone (DESYREL) tablet 50 mg  50 mg Oral QHS Ophelia Shoulder E, NP   50 mg at 09/21/19 2039    Lab Results:  Results for orders placed or performed during the hospital encounter of 09/21/19 (from the past 48 hour(s))  Hepatic function panel     Status: None   Collection Time: 09/22/19  6:35 AM  Result Value Ref Range   Total Protein 7.2 6.5 - 8.1 g/dL   Albumin 4.5 3.5 - 5.0 g/dL   AST 31 15 - 41 U/L   ALT 26 0 - 44 U/L   Alkaline Phosphatase 60 38 - 126 U/L   Total Bilirubin 0.5 0.3 - 1.2 mg/dL   Bilirubin, Direct 0.1 0.0 - 0.2 mg/dL   Indirect Bilirubin 0.4 0.3 - 0.9 mg/dL    Comment: Performed at [facility], [address] [place], [place] [address]  TSH     Status: None   Collection Time: 09/22/19  6:35 AM  Result Value Ref Range   TSH 1.539 0.350 - 4.500 uIU/mL    Comment: Performed by a 3rd Generation assay with a functional sensitivity of <=0.01 uIU/mL. Performed at [facility], [address], [place], [place] [address]     Blood Alcohol level:  Lab Results  Component Value Date   ETH <10 09/17/2019    Metabolic Disorder Labs: No results found for: HGBA1C, MPG No results found for: PROLACTIN No results found for: CHOL, TRIG, HDL, CHOLHDL, VLDL, LDLCALC  Physical Findings: AIMS: Facial and Oral Movements Muscles of Facial Expression: None, normal Lips and Perioral Area: None, normal Jaw: None, normal Tongue: None, normal,Extremity Movements Upper (arms, wrists, hands, fingers): None, normal Lower (legs, knees, ankles, toes): None, normal,  Trunk Movements Neck, shoulders, hips: None, normal, Overall Severity Severity of abnormal movements (highest score from questions above): None, normal Incapacitation due to abnormal movements: None, normal Patient's awareness of abnormal movements (rate only patient's report): No Awareness, Dental Status Current problems with teeth and/or dentures?: No Does patient usually wear dentures?: No  CIWA:    COWS:     Musculoskeletal: Strength & Muscle  Tone: within normal limits Gait & Station: normal Patient leans: N/A  Psychiatric Specialty Exam: Physical Exam Vitals and nursing note reviewed.  Constitutional:      Appearance: Normal appearance.  HENT:     Head: Normocephalic and atraumatic.  Pulmonary:     Effort: Pulmonary effort is normal.  Neurological:     General: No focal deficit present.     Mental Status: He is alert and oriented to person, place, and time.     Review of Systems  Blood pressure (!) 137/95, pulse 68, temperature 97.8 F (36.6 C), temperature source Oral, resp. rate 18, height 6\' 9"  (2.057 m), weight 95.3 kg, SpO2 100 %.Body mass index is 22.5 kg/m.  General Appearance: Casual  Eye Contact:  Good  Speech:  Pressured  Volume:  Increased  Mood:  Anxious and Dysphoric  Affect:  Congruent  Thought Process:  Coherent and Descriptions of Associations: Tangential  Orientation:  Full (Time, Place, and Person)  Thought Content:  Delusions  Suicidal Thoughts:  No  Homicidal Thoughts:  No  Memory:  Immediate;   Fair Recent;   Fair Remote;   Fair  Judgement:  Fair  Insight:  Fair  Psychomotor Activity:  Increased  Concentration:  Concentration: Fair and Attention Span: Fair  Recall:  of Knowledge:  Fair  Language:  Good  Akathisia:  Negative  Handed:  Right  AIMS (if indicated):     Assets:  Communication Skills Resilience  ADL's:  Intact  Cognition:  WNL  Sleep:  Number of Hours: 6.5     Treatment Plan Summary: Daily contact with  patient to assess and evaluate symptoms and progress in treatment, Medication management and Plan : Patient is seen and examined.  Patient is a 29 year old male with the above-stated past psychiatric history who is seen in follow-up.   Diagnosis: 1.  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 2.  Cannabis use disorder  Pertinent findings on examination today: 1.  Decreased tangentiality, pressured speech, racing thoughts and intrusiveness. 2.  Decreased mood from euphoria yesterday. 3.  Sleep is improved.  Plan: 1.  Increase Zyprexa to 5 mg p.o. daily and 20 mg p.o. nightly for mood stability, sleep and psychosis. 2.  Continue Tegretol 200 mg p.o. twice daily for mood stability. 3.  Continue trazodone 50 mg p.o. nightly as needed. 4.  Disposition planning-in progress.  36, MD 09/22/2019, 10:55 AM

## 2019-09-23 NOTE — [facility] Group Notes (Signed)
LCSW Group Therapy Notes  Type of Therapy and Topic: Group Therapy: Healthy Vs. Unhealthy Coping Strategies  Date and Time:   Participation Level: [facility] PARTICIPATION LEVEL: Active  Description of Group:  In this group, patients will be encouraged to explore their healthy and unhealthy coping strategics. Coping strategies are actions that we take to deal with stress, problems, or uncomfortable emotions in our daily lives. Each patient will be challenged to read some scenarios and discuss the unhealthy and healthy coping strategies within those scenarios. Also, each patient will be challenged to describe current healthy and unhealthy strategies that they use in their own lives and discuss the outcomes and barriers to those strategies. This group will be process-oriented, with patients participating in exploration of their own experiences as well as giving and receiving support and challenge from other group members.  Therapeutic Goals: 1. Patient will identify personal healthy and unhealthy coping strategies. 2. Patient will identify healthy and unhealthy coping strategies, in others, through scenarios.  3. Patient will identify expected outcomes of healthy and unhealthy coping strategies. 4. Patient will identify barriers to using healthy coping strategies.   Summary of Patient Progress:  Patient was engaged in group and was able to appropriately interact with his peers. This patient was slightly disorganized and had to be redirected often. This patient reported that he smokes marijuana as an unhealthy coping skill and that he makes music as a healthy coping skill.    Therapeutic Modalities:  Cognitive Behavioral Therapy Solution Focused Therapy Motivational Interviewing   Ruthann Cancer MSW, [employer] Clincal Social Worker  [facility]

## 2019-09-23 NOTE — Progress Notes (Signed)
   09/23/19 2100  Psych Admission Type (Psych Patients Only)  Admission Status Involuntary  Psychosocial Assessment  Patient Complaints Anxiety;Worrying  Eye Contact Fair  Facial Expression Animated;Anxious;Worried  Affect Anxious;Appropriate to circumstance;Preoccupied  Speech Logical/coherent  Interaction Assertive  Motor Activity Restless  Appearance/Hygiene Unremarkable  Behavior Characteristics Cooperative  Mood Anxious;Pleasant  Thought Process  Coherency Circumstantial  Content Preoccupation;Paranoia  Delusions Paranoid  Perception WDL  Hallucination None reported or observed  Judgment Poor  Confusion None  Danger to Self  Current suicidal ideation? Denies  Danger to Others  Danger to Others None reported or observed
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
## 2019-09-23 NOTE — [facility] Group Notes (Signed)
Adult Psychoeducational Group Note  Date:  09/23/2019 Time:  9:46 PM  Group Topic/Focus:  Wrap-Up Group:   The focus of this group is to help patients review their daily goal of treatment and discuss progress on daily workbooks.  Participation Level:  Active  Participation Quality:  Appropriate  Affect:  Excited  Cognitive:  Alert  Insight: Good  Engagement in Group:  Engaged  Modes of Intervention:  Discussion  Additional Comments:   Jacalyn Lefevre 09/23/2019, 9:46 PM

## 2019-09-23 NOTE — Progress Notes (Signed)
[facility] MD Progress Note  09/23/2019 11:24 AM William Snow  MRN:  [id number] Subjective:  Patient is a 29 year old male admitted on 96/24/21 secondary to increasing agitation and bizarre behavior.  The patient was reportedly talking to cars, and his brother reported that he had gone somewhere last week and had not been himself since he returned.  Objective: Patient is seen and examined.  Patient is a 29 year old male with the above-stated past psychiatric history who is seen in follow-up.  His pressured speech, intrusiveness and tangentiality has decreased, but unfortunately he does have some slurred speech today.  He is lethargic today.  Again he only slept 5.25 hours last night, but did get up at approximately 4 AM and needed something else to help him sleep.  He denied any suicidal or homicidal ideation.  He denied any auditory or visual hallucinations.  Nursing notes reflect his concern for previous head injury.  He is also displayed some paranoia.  His current medications include the Tegretol and olanzapine.  He is receiving 10 mg p.o. daily and 20 mg p.o. nightly.  His liver function enzymes on 6/29 were normal.  Principal Problem: <principal problem not specified> Diagnosis: Active Problems:   Psychoactive substance-induced psychosis (HCC)  Total Time spent with patient: 20 minutes  Past Psychiatric History: See admission H&P  Past Medical History: History reviewed. No pertinent past medical history. History reviewed. No pertinent surgical history. Family History: History reviewed. No pertinent family history. Family Psychiatric  History: See admission H&P Social History:  Social History   Substance and Sexual Activity  Alcohol Use Yes     Social History   Substance and Sexual Activity  Drug Use Yes  . Types: Marijuana   Comment: pt  reports 28 grams daily    Social History   Socioeconomic History  . Marital status: Single    Spouse name: Not on file  . Number of children:  Not on file  . Years of education: Not on file  . Highest education level: Not on file  Occupational History  . Not on file  Tobacco Use  . Smoking status: Current Every Day Smoker  . Smokeless tobacco: Never Used  Substance and Sexual Activity  . Alcohol use: Yes  . Drug use: Yes    Types: Marijuana    Comment: pt  reports 28 grams daily  . Sexual activity: Not on file  Other Topics Concern  . Not on file  Social History Narrative  . Not on file   Social Determinants of Health   Financial Resource Strain:   . Difficulty of Paying Living Expenses:   Food Insecurity:   . Worried About Programme researcher, broadcasting/film/video in the Last Year:   . Barista in the Last Year:   Transportation Needs:   . Freight forwarder (Medical):   Marland Kitchen Lack of Transportation (Non-Medical):   Physical Activity:   . Days of Exercise per Week:   . Minutes of Exercise per Session:   Stress:   . Feeling of Stress :   Social Connections:   . Frequency of Communication with Friends and Family:   . Frequency of Social Gatherings with Friends and Family:   . Attends Religious Services:   . Active Member of Clubs or Organizations:   . Attends Banker Meetings:   Marland Kitchen Marital Status:    Additional Social History:  Sleep: Fair  Appetite:  Good  Current Medications: Current Facility-Administered Medications  Medication Dose Route Frequency Provider Last Rate Last Admin  . acetaminophen (TYLENOL) tablet 650 mg  650 mg Oral Q6H PRN Ophelia Shoulder E, NP      . alum & mag hydroxide-simeth (MAALOX/MYLANTA) 200-200-20 MG/5ML suspension 30 mL  30 mL Oral Q4H PRN Ophelia Shoulder E, NP      . carbamazepine (TEGRETOL) chewable tablet 200 mg  200 mg Oral BID Antonieta Pert, MD   200 mg at 09/23/19 0913  . ziprasidone (GEODON) injection 20 mg  20 mg Intramuscular Q6H PRN Antonieta Pert, MD       And  . OLANZapine zydis (ZYPREXA) disintegrating tablet 10 mg  10 mg  Oral Q8H PRN Antonieta Pert, MD   10 mg at 09/22/19 1116   And  . LORazepam (ATIVAN) tablet 2 mg  2 mg Oral Q6H PRN Antonieta Pert, MD   2 mg at 09/23/19 0420  . magnesium hydroxide (MILK OF MAGNESIA) suspension 30 mL  30 mL Oral Daily PRN Ophelia Shoulder E, NP      . OLANZapine (ZYPREXA) tablet 10 mg  10 mg Oral Daily Antonieta Pert, MD   10 mg at 09/23/19 0913  . OLANZapine zydis (ZYPREXA) disintegrating tablet 20 mg  20 mg Oral QHS Antonieta Pert, MD   20 mg at 09/22/19 2054  . traZODone (DESYREL) tablet 50 mg  50 mg Oral QHS Ophelia Shoulder E, NP   50 mg at 09/22/19 2054    Lab Results:  Results for orders placed or performed during the hospital encounter of 09/21/19 (from the past 48 hour(s))  Hepatic function panel     Status: None   Collection Time: 09/22/19  6:35 AM  Result Value Ref Range   Total Protein 7.2 6.5 - 8.1 g/dL   Albumin 4.5 3.5 - 5.0 g/dL   AST 31 15 - 41 U/L   ALT 26 0 - 44 U/L   Alkaline Phosphatase 60 38 - 126 U/L   Total Bilirubin 0.5 0.3 - 1.2 mg/dL   Bilirubin, Direct 0.1 0.0 - 0.2 mg/dL   Indirect Bilirubin 0.4 0.3 - 0.9 mg/dL    Comment: Performed at [facility], [address], [place], [place] [address]  TSH     Status: None   Collection Time: 09/22/19  6:35 AM  Result Value Ref Range   TSH 1.539 0.350 - 4.500 uIU/mL    Comment: Performed by a 3rd Generation assay with a functional sensitivity of <=0.01 uIU/mL. Performed at [facility], [address] [place], [place] [address]     Blood Alcohol level:  Lab Results  Component Value Date   ETH <10 09/17/2019    Metabolic Disorder Labs: No results found for: HGBA1C, MPG No results found for: PROLACTIN No results found for: CHOL, TRIG, HDL, CHOLHDL, VLDL, LDLCALC  Physical Findings: AIMS: Facial and Oral Movements Muscles of Facial Expression: None, normal Lips and Perioral Area: None, normal Jaw: None, normal Tongue: None,  normal,Extremity Movements Upper (arms, wrists, hands, fingers): None, normal Lower (legs, knees, ankles, toes): None, normal, Trunk Movements Neck, shoulders, hips: None, normal, Overall Severity Severity of abnormal movements (highest score from questions above): None, normal Incapacitation due to abnormal movements: None, normal Patient's awareness of abnormal movements (rate only patient's report): No Awareness, Dental Status Current problems with teeth and/or dentures?: No Does patient usually wear dentures?: No  CIWA:  COWS:     Musculoskeletal: Strength & Muscle Tone: within normal limits Gait & Station: normal Patient leans: N/A  Psychiatric Specialty Exam: Physical Exam Vitals and nursing note reviewed.  Constitutional:      Appearance: Normal appearance.  HENT:     Head: Normocephalic and atraumatic.  Pulmonary:     Effort: Pulmonary effort is normal.  Neurological:     General: No focal deficit present.     Mental Status: He is alert and oriented to person, place, and time.     Review of Systems  Blood pressure (!) 137/95, pulse 68, temperature 97.8 F (36.6 C), temperature source Oral, resp. rate 18, height 6\' 9"  (2.057 m), weight 95.3 kg, SpO2 100 %.Body mass index is 22.5 kg/m.  General Appearance: Disheveled  Eye Contact:  Fair  Speech:  Normal Rate  Volume:  Decreased  Mood:  Dysphoric  Affect:  Congruent  Thought Process:  Coherent and Descriptions of Associations: Circumstantial  Orientation:  Full (Time, Place, and Person)  Thought Content:  Negative  Suicidal Thoughts:  No  Homicidal Thoughts:  No  Memory:  Immediate;   Fair Recent;   Fair Remote;   Fair  Judgement:  Intact  Insight:  Fair  Psychomotor Activity:  Decreased  Concentration:  Concentration: Poor and Attention Span: Poor  Recall:  Poor  Fund of Knowledge:  Fair  Language:  Fair  Akathisia:  Negative  Handed:  Right  AIMS (if indicated):     Assets:  Desire for  Improvement Resilience  ADL's:  Intact  Cognition:  WNL  Sleep:  Number of Hours: 5.25     Treatment Plan Summary: Daily contact with patient to assess and evaluate symptoms and progress in treatment, Medication management and Plan : Patient is seen and examined.  Patient is a 29 year old male with the above-stated past psychiatric history who is seen in follow-up.   Diagnosis: 1.  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 2.  Cannabis use disorder  Pertinent findings on examination today: 1.  Oversedation with slurred speech most likely due to overmedication. 2.  No evidence of euphoria today. 3.  Decrease tangentiality, pressured speech, racing thoughts and intrusiveness. 4.  Sleep is still somewhat a problem.  Plan: 1.  Continue Zyprexa 20 mg p.o. nightly but stop daytime Zyprexa. 2.  Continue Tegretol 200 mg p.o. twice daily for mood stability. 3.  Increase trazodone 200 mg p.o. nightly but changed to as needed for insomnia. 4.  Disposition planning-in progress.  36, MD 09/23/2019, 11:24 AM

## 2019-09-24 NOTE — Progress Notes (Signed)
Recreation Therapy Notes  Date: 7.1.21 Time: 1000 Location: 500 Hall Dayroom  Group Topic: Communication, Team Building, Problem Solving  Goal Area(s) Addresses:  Patient will effectively work with peer towards shared goal.  Patient will identify skill used to make activity successful.  Patient will identify how skills used during activity can be used to reach post d/c goals.   Behavioral Response: Engaged  Intervention: STEM Activity   Activity: In team's, using 20 small plastic cups, patients were asked to build the tallest free standing tower possible.    Education: Pharmacist, community, Building control surveyor.   Education Outcome: Acknowledges education/In group clarification offered/Needs additional education.   Clinical Observations/Feedback:  Pt was active and engaged well with peers.  Pt and peers seemed to discuss how they wanted the structure to be as they worked on it.  Pt was pleasant and attentive to any suggestions his peers had to offer.  Pt was also social with LRT during group session about some of the things he wants for himself when discharged.     Caroll Rancher, LRT/CTRS    Lillia Abed, Haylo Fake A 09/24/2019 11:04 AM

## 2019-09-24 NOTE — Progress Notes (Signed)
[facility] MD Progress Note  09/24/2019 11:45 AM William Snow  MRN:  [id number] Subjective:  Patient is a 29 year old male admitted on 96/24/21 secondary to increasing agitation and bizarre behavior. The patient was reportedly talking to cars, and his brother reported that he had gone somewhere last week and had not been himself since he returned.  Objective: Patient is seen and examined.  Patient is a 29 year old male with the above-stated past psychiatric history who is seen in follow-up.  He continues to slowly improve.  His pressured speech, intrusiveness and tangentiality have continued to improve.  His slurred speech from yesterday is improved today.  He is much less lethargic.  His vital signs are stable, he is afebrile.  He slept 7.25 hours last night.  He denied auditory or visual hallucinations.  He denied any suicidal or homicidal ideation.  His daytime Zyprexa was stopped, and he received 20 mg of Zyprexa nightly last night.  He also continues on Tegretol 200 mg p.o. twice daily.  Principal Problem: <principal problem not specified> Diagnosis: Active Problems:   Psychoactive substance-induced psychosis (HCC)  Total Time spent with patient: 20 minutes  Past Psychiatric History: See admission H&P  Past Medical History: History reviewed. No pertinent past medical history. History reviewed. No pertinent surgical history. Family History: History reviewed. No pertinent family history. Family Psychiatric  History: See admission H&P Social History:  Social History   Substance and Sexual Activity  Alcohol Use Yes     Social History   Substance and Sexual Activity  Drug Use Yes  . Types: Marijuana   Comment: pt  reports 28 grams daily    Social History   Socioeconomic History  . Marital status: Single    Spouse name: Not on file  . Number of children: Not on file  . Years of education: Not on file  . Highest education level: Not on file  Occupational History  . Not on file   Tobacco Use  . Smoking status: Current Every Day Smoker  . Smokeless tobacco: Never Used  Substance and Sexual Activity  . Alcohol use: Yes  . Drug use: Yes    Types: Marijuana    Comment: pt  reports 28 grams daily  . Sexual activity: Not on file  Other Topics Concern  . Not on file  Social History Narrative  . Not on file   Social Determinants of Health   Financial Resource Strain:   . Difficulty of Paying Living Expenses:   Food Insecurity:   . Worried About Programme researcher, broadcasting/film/video in the Last Year:   . Barista in the Last Year:   Transportation Needs:   . Freight forwarder (Medical):   Marland Kitchen Lack of Transportation (Non-Medical):   Physical Activity:   . Days of Exercise per Week:   . Minutes of Exercise per Session:   Stress:   . Feeling of Stress :   Social Connections:   . Frequency of Communication with Friends and Family:   . Frequency of Social Gatherings with Friends and Family:   . Attends Religious Services:   . Active Member of Clubs or Organizations:   . Attends Banker Meetings:   Marland Kitchen Marital Status:    Additional Social History:                         Sleep: Good  Appetite:  Good  Current Medications: Current Facility-Administered Medications  Medication Dose Route Frequency  Provider Last Rate Last Admin  . acetaminophen (TYLENOL) tablet 650 mg  650 mg Oral Q6H PRN Ophelia Shoulder E, NP      . alum & mag hydroxide-simeth (MAALOX/MYLANTA) 200-200-20 MG/5ML suspension 30 mL  30 mL Oral Q4H PRN Ophelia Shoulder E, NP      . carbamazepine (TEGRETOL) chewable tablet 200 mg  200 mg Oral BID Antonieta Pert, MD   200 mg at 09/24/19 0726  . ziprasidone (GEODON) injection 20 mg  20 mg Intramuscular Q6H PRN Antonieta Pert, MD       And  . OLANZapine zydis (ZYPREXA) disintegrating tablet 10 mg  10 mg Oral Q8H PRN Antonieta Pert, MD   10 mg at 09/22/19 1116   And  . LORazepam (ATIVAN) tablet 2 mg  2 mg Oral Q6H PRN Antonieta Pert, MD   2 mg at 09/23/19 0420  . magnesium hydroxide (MILK OF MAGNESIA) suspension 30 mL  30 mL Oral Daily PRN Ophelia Shoulder E, NP      . OLANZapine zydis (ZYPREXA) disintegrating tablet 20 mg  20 mg Oral QHS Antonieta Pert, MD   20 mg at 09/23/19 2101  . traZODone (DESYREL) tablet 50 mg  50 mg Oral QHS Ophelia Shoulder E, NP   50 mg at 09/23/19 2101    Lab Results: No results found for this or any previous visit (from the past 48 hour(s)).  Blood Alcohol level:  Lab Results  Component Value Date   ETH <10 09/17/2019    Metabolic Disorder Labs: No results found for: HGBA1C, MPG No results found for: PROLACTIN No results found for: CHOL, TRIG, HDL, CHOLHDL, VLDL, LDLCALC  Physical Findings: AIMS: Facial and Oral Movements Muscles of Facial Expression: None, normal Lips and Perioral Area: None, normal Jaw: None, normal Tongue: None, normal,Extremity Movements Upper (arms, wrists, hands, fingers): None, normal Lower (legs, knees, ankles, toes): None, normal, Trunk Movements Neck, shoulders, hips: None, normal, Overall Severity Severity of abnormal movements (highest score from questions above): None, normal Incapacitation due to abnormal movements: None, normal Patient's awareness of abnormal movements (rate only patient's report): No Awareness, Dental Status Current problems with teeth and/or dentures?: No Does patient usually wear dentures?: No  CIWA:    COWS:     Musculoskeletal: Strength & Muscle Tone: within normal limits Gait & Station: normal Patient leans: N/A  Psychiatric Specialty Exam: Physical Exam Vitals and nursing note reviewed.  Constitutional:      Appearance: Normal appearance.  HENT:     Head: Normocephalic and atraumatic.  Pulmonary:     Effort: Pulmonary effort is normal.  Neurological:     General: No focal deficit present.     Mental Status: He is alert and oriented to person, place, and time.     Review of Systems  Blood pressure  (!) 140/103, pulse (!) 103, temperature 98 F (36.7 C), temperature source Oral, resp. rate 18, height 6\' 9"  (2.057 m), weight 95.3 kg, SpO2 94 %.Body mass index is 22.5 kg/m.  General Appearance: Casual  Eye Contact:  Good  Speech:  Normal Rate  Volume:  Normal  Mood:  Euthymic  Affect:  Congruent  Thought Process:  Coherent and Descriptions of Associations: Intact  Orientation:  Full (Time, Place, and Person)  Thought Content:  Logical  Suicidal Thoughts:  No  Homicidal Thoughts:  No  Memory:  Immediate;   Fair Recent;   Fair Remote;   Fair  Judgement:  Intact  Insight:  Fair  Psychomotor Activity:  Normal  Concentration:  Concentration: Good and Attention Span: Good  Recall:  Good  Fund of Knowledge:  Good  Language:  Good  Akathisia:  Negative  Handed:  Right  AIMS (if indicated):     Assets:  Desire for Improvement Resilience  ADL's:  Intact  Cognition:  WNL  Sleep:  Number of Hours: 7.25     Treatment Plan Summary: Daily contact with patient to assess and evaluate symptoms and progress in treatment, Medication management and Plan : Patient is seen and examined.  Patient is a 29 year old male with the above-stated past psychiatric history who is seen in follow-up.  Diagnosis: 1. 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 2. Cannabis use disorder  Pertinent findings on examination today: 1.  Oversedation with slurred speech is much improved. 2.  No evidence of euphoria or psychosis. 3.  Decreased tangentiality, pressured speech, racing thoughts and intrusiveness. 4.  Sleep is improved.  Plan: 1.  Continue Zyprexa 20 mg p.o. nightly for mood stability and psychosis. 2.  Continue Tegretol 200 mg p.o. twice daily for mood stability. 3.  Continue trazodone 100 mg p.o. nightly as needed insomnia. 4.  Order CBC with differential, liver function enzymes and Tegretol level in a.m. tomorrow. 5.  Disposition  planning-if all goes well I anticipate discharge in 1 to 2 days.  Antonieta Pert, MD 09/24/2019, 11:45 AM

## 2019-09-24 NOTE — Plan of Care (Signed)
Progress note  D: pt found in bed; compliant with medication administration. Pt has been viewed interacting appropriately with peers in the dayroom. 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 Pt denies any physical complaints or pain. Pt denies si/hi/ah/vh and verbally agrees to approach staff if these become apparent or before harming themself/others while at [facility].  A: Pt provided support and encouragement. Pt given medication per protocol and standing orders. Q30m safety checks implemented and continued.  R: Pt safe on the unit. Will continue to monitor.  Pt progressing in the following metrics  Problem: Activity: Goal: Interest or engagement in activities will improve Outcome: Progressing Goal: Sleeping patterns will improve Outcome: Progressing   Problem: Coping: Goal: Ability to verbalize frustrations and anger appropriately will improve Outcome: Progressing Goal: Ability to demonstrate self-control will improve Outcome: Progressing

## 2019-09-25 LAB — CBC WITH DIFFERENTIAL/PLATELET
Abs Immature Granulocytes: 0.01 10*3/uL (ref 0.00–0.07)
Basophils Absolute: 0.1 10*3/uL (ref 0.0–0.1)
Basophils Relative: 1 %
Eosinophils Absolute: 0.6 10*3/uL — ABNORMAL HIGH (ref 0.0–0.5)
Eosinophils Relative: 9 %
HCT: 48 % (ref 39.0–52.0)
Hemoglobin: 15.5 g/dL (ref 13.0–17.0)
Immature Granulocytes: 0 %
Lymphocytes Relative: 56 %
Lymphs Abs: 3.8 10*3/uL (ref 0.7–4.0)
MCH: 32 pg (ref 26.0–34.0)
MCHC: 32.3 g/dL (ref 30.0–36.0)
MCV: 99 fL (ref 80.0–100.0)
Monocytes Absolute: 0.5 10*3/uL (ref 0.1–1.0)
Monocytes Relative: 8 %
Neutro Abs: 1.7 10*3/uL (ref 1.7–7.7)
Neutrophils Relative %: 26 %
Platelets: 237 10*3/uL (ref 150–400)
RBC: 4.85 MIL/uL (ref 4.22–5.81)
RDW: 12.7 % (ref 11.5–15.5)
WBC: 6.8 10*3/uL (ref 4.0–10.5)
nRBC: 0 % (ref 0.0–0.2)

## 2019-09-25 LAB — HEPATIC FUNCTION PANEL
ALT: 31 U/L (ref 0–44)
AST: 30 U/L (ref 15–41)
Albumin: 4.6 g/dL (ref 3.5–5.0)
Alkaline Phosphatase: 61 U/L (ref 38–126)
Bilirubin, Direct: 0.1 mg/dL (ref 0.0–0.2)
Indirect Bilirubin: 0.3 mg/dL (ref 0.3–0.9)
Total Bilirubin: 0.4 mg/dL (ref 0.3–1.2)
Total Protein: 7.4 g/dL (ref 6.5–8.1)

## 2019-09-25 LAB — CARBAMAZEPINE LEVEL, TOTAL: Carbamazepine Lvl: 8.4 ug/mL (ref 4.0–12.0)

## 2019-09-25 MED ORDER — TRAZODONE HCL 50 MG PO TABS: 50.0000 mg | ORAL_TABLET | Freq: Every day | ORAL | 0 refills | Status: AC

## 2019-09-25 MED ORDER — OLANZAPINE 20 MG PO TBDP: 20.0000 mg | ORAL_TABLET | Freq: Every day | ORAL | 0 refills | Status: AC

## 2019-09-25 MED ORDER — CARBAMAZEPINE 100 MG PO CHEW: 200.0000 mg | CHEWABLE_TABLET | Freq: Two times a day (BID) | ORAL | 0 refills | Status: AC

## 2019-09-25 NOTE — Progress Notes (Signed)
  [facility] Adult Case Management Discharge Plan :  Will you be returning to the same living situation after discharge:  Yes,  home with mother. At discharge, do you have transportation home?: No. Safe Transport will be arranged. Do you have the ability to pay for your medications: No. Samples will be provided at discharge.   Release of information consent forms completed and in the chart;  Patient's signature needed at discharge.  Patient to Follow up at:  Follow-up Information    [facility] Go on 10/06/2019.   Specialty: Behavioral Health Why: You are scheduled for therapy on 10/06/19 at 4:00 pm.  This appointment will be held in person.  Please arrive at 3:40 pm.  You also have an appointment for medication management on 10/29/19 at 8:30 am, in person. Contact information: [address] [place] [address] [phone]              Next level of care provider has access to [facility] Link:yes  Safety Planning and Suicide Prevention discussed: Yes,  with mother.  Have you used any form of tobacco in the last 30 days? (Cigarettes, Smokeless Tobacco, Cigars, and/or Pipes): Yes  Has patient been referred to the Quitline?: Patient refused referral  Patient has been referred for addiction treatment: Pt. refused referral  Otelia Santee, LCSWA 09/25/2019, 10:05 AM

## 2019-09-25 NOTE — Progress Notes (Signed)
   09/24/19 2200  Psych Admission Type (Psych Patients Only)  Admission Status Involuntary  Psychosocial Assessment  Patient Complaints None  Eye Contact Fair  Facial Expression Animated;Anxious  Affect Anxious;Appropriate to circumstance  Speech Logical/coherent  Interaction Assertive  Motor Activity Restless  Appearance/Hygiene Unremarkable  Behavior Characteristics Cooperative;Appropriate to situation  Mood Anxious;Pleasant  Thought Process  Coherency Circumstantial  Content Paranoia;Preoccupation  Delusions Paranoid  Perception WDL  Hallucination None reported or observed  Judgment Impaired  Confusion None  Danger to Self  Current suicidal ideation? Denies  Danger to Others  Danger to Others None reported or observed  Pt. States that he is doing ok, states that he is excited/hopeful for discharge tomorrow.  Pt. Is visible in the milieu interacting with staff and others.  Pt. Denies any physical complaints, medication given as ordered, support and encouragement provided.

## 2019-09-25 NOTE — Discharge Summary (Signed)
Physician Discharge Summary Note  Patient:  William Snow is an 29 y.o., male MRN:  [id number] DOB:  Jul 31, 1990 Patient phone:  [phone] (home)  Patient address:   [address] [place] [id number],  Total Time spent with patient: Greater than 30 minutes  Date of Admission:  09/21/2019 Date of Discharge: 09-25-19  Reason for Admission: Worsening agitation & Bizarre behavior.  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 No pertinent past medical history. History reviewed. No pertinent surgical history.  Family History: History reviewed. No pertinent family history.  Family Psychiatric  History: See H&P  Social History:  Social History   Substance and Sexual Activity  Alcohol Use Yes     Social History   Substance and Sexual Activity  Drug Use Yes  . Types: Marijuana   Comment: pt  reports 28 grams daily    Social History   Socioeconomic History  . Marital status: Single    Spouse name: Not on file  . Number of children: Not on file  . Years of education: Not on file  . Highest education level: Not on file  Occupational History  . Not on file  Tobacco Use  . Smoking status: Current Every Day Smoker  . Smokeless tobacco: Never Used  Substance and Sexual Activity  . Alcohol use: Yes  . Drug use: Yes    Types: Marijuana    Comment: pt  reports 28 grams daily  . Sexual activity: Not on file  Other Topics Concern  . Not on file  Social History Narrative  . Not on file   Social Determinants of Health   Financial Resource Strain:   . Difficulty of Paying Living Expenses:   Food Insecurity:   . Worried About Programme researcher, broadcasting/film/videounning Out of Food in the Last Year:   . Baristaan Out of Food in  the Last Year:   Transportation Needs:   . Freight forwarderLack of Transportation (Medical):   Marland Kitchen. Lack of Transportation (Non-Medical):   Physical Activity:   . Days of Exercise per Week:   . Minutes of Exercise per Session:   Stress:   . Feeling of Stress :   Social Connections:   . Frequency of Communication with Friends and Family:   . Frequency of Social Gatherings with Friends and Family:   . Attends Religious Services:   . Active Member of Clubs or Organizations:   . Attends BankerClub or Organization Meetings:   Marland Kitchen. Marital Status:    Hospital Course: (Per Md's admission evaluation notes): Patient is seen and examined. Patient is a 29 year old male who originally presented on 09/17/2019 to the [facility] emergency department under involuntary commitment. This was reportedly for increasing agitation and bizarre behavior. The patient was reportedly talking to cars. I report from the patient's brother stated that the patient had gone somewhere last week, and had not been himself since he had returned. He is pressured throughout the interview, tangential and difficult to address. He stated that "I am fine now, I talk to my mother and everything is good". He stated that he is currently in a custody battle with the mother of his child for some unspecified reason. He stated the only drug he had been using recently was marijuana. He stated in the past that he had been at [facility], and it  was because he was "taking cocaine". He stated he was clean of that hand and had not used. He stated he was taking medications while he was on the inpatient service, but he did not continue any medications after discharge. There are notes in the chart that also revealed that he was having delusional thinking about his mother doing witchcraft, but he was resistant to answering these questions to her evaluation.On admission that he was recommended inpatient treatment and starting Zyprexa 5 mg p.o. nightly for psychosis.  While he was in the emergency department on 6/26 he had increased agitation and had to receive Geodon 10 mg IM. There is also consideration of giving Geodon on several occasions secondary to increased agitation while waiting in the emergency room. He was transferred to our facility on 09/20/2019.  After the above admission evaluation, it was recommended based on his presenting symptoms that William Snow will benefit from mood stabilization treatment. And with his consent, he was started on the medication regimen that purposefully targeted his symptoms. He was instructed & explained the benefit/adverse effects of the medication in use. He was given the time to ask questions and voice any concerns that he may have. He received, stabilized & was discharged on the medications as listed below on his discharge medication lists. He was also enrolled & participated in the group counseling sessions being offered and held on this unit. William Snow learned coping skills that should help him after discharge to cope better & maintain mood stability/sobriety.  William Snow's symptoms responded well to his treatment regimen. This is evidenced by his daily reports of improved mood, absence of agitations/bizarre behaviors, homicidal ideations & or AV hallucinations. He is currently mentally & medically stable to be discharged to continue mental health care, medication management & substance abuse treatment as noted below.  During the course of his hospitalization, the 15-minute checks were adequate to ensure patient's safety. Patient did not display any dangerous, violent or suicidal behavior on the unit.  He interacted with patients & staff appropriately and participated appropriately in the group therapy sessions.  His medications were addressed & adjusted to meet his needs.  At the time of discharge patient is not reporting any acute suicidal ideation & feels more confident about his self-care and managing his mental health.  Denies  suicidal/homicidal ideations.  Education and supportive counseling provided. He was able to engage in safety planning including plan to return to [facility] or contact emergency services if he feels unable to maintain his own safety or the safety of others. Pt had no further questions, comments, or concerns. He left [facility] with all personal belongings in no apparent distress. Transportation per the [employer]..  Physical Findings: AIMS: Facial and Oral Movements Muscles of Facial Expression: None, normal Lips and Perioral Area: None, normal Jaw: None, normal Tongue: None, normal,Extremity Movements Upper (arms, wrists, hands, fingers): None, normal Lower (legs, knees, ankles, toes): None, normal, Trunk Movements Neck, shoulders, hips: None, normal, Overall Severity Severity of abnormal movements (highest score from questions above): None, normal Incapacitation due to abnormal movements: None, normal Patient's awareness of abnormal movements (rate only patient's report): No Awareness, Dental Status Current problems with teeth and/or dentures?: No Does patient usually wear dentures?: No  CIWA:    COWS:     Musculoskeletal: Strength & Muscle Tone: within normal limits Gait & Station: normal Patient leans: N/A  Psychiatric Specialty Exam: Physical Exam Vitals and nursing note reviewed.  HENT:     Head: Normocephalic.  Nose: Nose normal.     Mouth/Throat:     Pharynx: Oropharynx is clear.  Eyes:     Pupils: Pupils are equal, round, and reactive to light.  Cardiovascular:     Rate and Rhythm: Normal rate.     Pulses: Normal pulses.  Pulmonary:     Effort: Pulmonary effort is normal.  Genitourinary:    Comments: Deferred Musculoskeletal:        General: Normal range of motion.     Cervical back: Normal range of motion.  Skin:    General: Skin is warm and dry.  Neurological:     Mental Status: He is alert and oriented to person, place, and time.     Review of Systems   Constitutional: Negative for chills, diaphoresis and fever.  HENT: Negative for congestion, rhinorrhea, sneezing and sore throat.   Eyes: Negative for discharge.  Respiratory: Negative for cough, chest tightness, shortness of breath and wheezing.   Cardiovascular: Negative for chest pain and palpitations.  Gastrointestinal: Negative for diarrhea, nausea and vomiting.  Endocrine: Negative for cold intolerance.  Genitourinary: Negative for difficulty urinating.  Musculoskeletal: Negative for arthralgias and myalgias.  Allergic/Immunologic:       Allergies: NKDA  Neurological: Negative for dizziness, tremors, seizures, syncope, speech difficulty, weakness, light-headedness, numbness and headaches.  Hematological: Negative.   Psychiatric/Behavioral: Positive for dysphoric mood (Stabilized with medication prior to discharge), hallucinations (Hx. Psychosis (Stabilized with medication prior to discharge)) and sleep disturbance (Stabilized with medication prior to discharge). Negative for agitation, behavioral problems, confusion, decreased concentration, self-injury and suicidal ideas. The patient is not nervous/anxious (Stable) and is not hyperactive.     Blood pressure 138/89, pulse 95, temperature 97.6 F (36.4 C), temperature source Oral, resp. rate 18, height 6\' 9"  (2.057 m), weight 95.3 kg, SpO2 100 %.Body mass index is 22.5 kg/m.  See Md's discharge SRA  Sleep:  Number of Hours: 7.25   Have you used any form of tobacco in the last 30 days? (Cigarettes, Smokeless Tobacco, Cigars, and/or Pipes): Yes  Has this patient used any form of tobacco in the last 30 days? (Cigarettes, Smokeless Tobacco, Cigars, and/or Pipes): N/A  Blood Alcohol level:  Lab Results  Component Value Date   ETH <10 09/17/2019   Metabolic Disorder Labs:  No results found for: HGBA1C, MPG No results found for: PROLACTIN No results found for: CHOL, TRIG, HDL, CHOLHDL, VLDL, LDLCALC  See Psychiatric Specialty Exam  and Suicide Risk Assessment completed by Attending Physician prior to discharge.  Discharge destination:  Home  Is patient on multiple antipsychotic therapies at discharge:  No   Has Patient had three or more failed trials of antipsychotic monotherapy by history:  No  Recommended Plan for Multiple Antipsychotic Therapies: NA  Allergies as of 09/25/2019   No Known Allergies     Medication List    STOP taking these medications   traMADol 50 MG tablet Commonly known as: ULTRAM     TAKE these medications     Indication  carbamazepine 100 MG chewable tablet Commonly known as: TEGRETOL Chew 2 tablets (200 mg total) by mouth 2 (two) times daily. For mood stabilization  Indication: Mood stabilization   OLANZapine zydis 20 MG disintegrating tablet Commonly known as: ZYPREXA Take 1 tablet (20 mg total) by mouth at bedtime. For mood control  Indication: Mood control   traZODone 50 MG tablet Commonly known as: DESYREL Take 1 tablet (50 mg total) by mouth at bedtime. For sleep  Indication: Trouble Sleeping  Follow-up Information    [facility]. Go on 10/06/2019.   Specialty: Behavioral Health Why: You are scheduled for therapy on 10/06/19 at 4:00 pm.  This appointment will be held in person.  Please arrive at 3:40 pm.  You also have an appointment for medication management on 10/29/19 at 8:30 am, in person. Contact information: [address] [place] [address] [phone]             Follow-up recommendations: Activity:  As tolerated Diet: As recommended by your primary care doctor. Keep all scheduled follow-up appointments as recommended.  Comments: Prescriptions given at discharge.  Patient agreeable to plan.  Given opportunity to ask questions.  Appears to feel comfortable with discharge denies any current suicidal or homicidal thought. Patient is also instructed prior to discharge to: Take all medications as prescribed by  his/her mental healthcare provider. Report any adverse effects and or reactions from the medicines to his/her outpatient provider promptly. Patient has been instructed & cautioned: To not engage in alcohol and or illegal drug use while on prescription medicines. In the event of worsening symptoms, patient is instructed to call the crisis hotline, 911 and or go to the nearest ED for appropriate evaluation and treatment of symptoms. To follow-up with his/her primary care provider for your other medical issues, concerns and or health care needs.  Signed: Armandina Stammer, NP, PMHNP, FNP-BC 09/25/2019, 10:23 AM

## 2019-09-25 NOTE — Progress Notes (Signed)
D: Pt A & O X 3. Denies SI, HI, AVH and pain at this time. D/C home as ordered. Picked up in front of facility by [employer].   A: D/C instructions reviewed with pt including prescriptions, medication samples and follow up appointment; compliance encouraged. All belongings from locker 35 returned to pt at time of departure. Scheduled medications administered with verbal education and effects monitored. Safety checks maintained without incident till time of d/c.  R: Pt receptive to care. Compliant with medications when offered. Denies adverse drug reactions when assessed. Verbalized understanding related to d/c instructions. Signed belonging sheet in agreement with items received from locker. Ambulatory with a steady gait. Appears to be in no physical distress at time of departure.

## 2019-09-25 NOTE — Tx Team (Signed)
Interdisciplinary Treatment and Diagnostic Plan Update  09/25/2019 Time of Session: 9:20am William Snow MRN: [id number]  Principal Diagnosis: <principal problem not specified>  Secondary Diagnoses: Active Problems:   Psychoactive substance-induced psychosis (HCC)   Current Medications:  Current Facility-Administered Medications  Medication Dose Route Frequency Provider Last Rate Last Admin  . acetaminophen (TYLENOL) tablet 650 mg  650 mg Oral Q6H PRN Ophelia Shoulder E, NP      . alum & mag hydroxide-simeth (MAALOX/MYLANTA) 200-200-20 MG/5ML suspension 30 mL  30 mL Oral Q4H PRN Ophelia Shoulder E, NP      . carbamazepine (TEGRETOL) chewable tablet 200 mg  200 mg Oral BID Antonieta Pert, MD   200 mg at 09/25/19 0757  . ziprasidone (GEODON) injection 20 mg  20 mg Intramuscular Q6H PRN Antonieta Pert, MD       And  . OLANZapine zydis (ZYPREXA) disintegrating tablet 10 mg  10 mg Oral Q8H PRN Antonieta Pert, MD   10 mg at 09/24/19 1632   And  . LORazepam (ATIVAN) tablet 2 mg  2 mg Oral Q6H PRN Antonieta Pert, MD   2 mg at 09/23/19 0420  . magnesium hydroxide (MILK OF MAGNESIA) suspension 30 mL  30 mL Oral Daily PRN Ophelia Shoulder E, NP      . OLANZapine zydis (ZYPREXA) disintegrating tablet 20 mg  20 mg Oral QHS Antonieta Pert, MD   20 mg at 09/24/19 2115  . traZODone (DESYREL) tablet 50 mg  50 mg Oral QHS Ophelia Shoulder E, NP   50 mg at 09/24/19 2115   PTA Medications: Medications Prior to Admission  Medication Sig Dispense Refill Last Dose  . traMADol (ULTRAM) 50 MG tablet Take 1 tablet (50 mg total) by mouth every 6 (six) hours as needed. (Patient not taking: Reported on 09/17/2019) 15 tablet 0 Unknown at Unknown time    Patient Stressors: Marital or family conflict Substance abuse  Patient Strengths: Active sense of humor Average or above average intelligence Motivation for treatment/growth  Treatment Modalities: Medication Management, Group therapy, Case management,   1 to 1 session with clinician, Psychoeducation, Recreational therapy.   Physician Treatment Plan for Primary Diagnosis: <principal problem not specified> Long Term Goal(s): Improvement in symptoms so as ready for discharge Improvement in symptoms so as ready for discharge   Short Term Goals: Ability to identify changes in lifestyle to reduce recurrence of condition will improve Ability to verbalize feelings will improve Ability to demonstrate self-control will improve Ability to identify and develop effective coping behaviors will improve Ability to maintain clinical measurements within normal limits will improve Ability to identify triggers associated with substance abuse/mental health issues will improve Ability to identify changes in lifestyle to reduce recurrence of condition will improve Ability to verbalize feelings will improve Ability to demonstrate self-control will improve Ability to identify and develop effective coping behaviors will improve Ability to maintain clinical measurements within normal limits will improve Ability to identify triggers associated with substance abuse/mental health issues will improve  Medication Management: Evaluate patient's response, side effects, and tolerance of medication regimen.  Therapeutic Interventions: 1 to 1 sessions, Unit Group sessions and Medication administration.  Evaluation of Outcomes: Adequate for Discharge  Physician Treatment Plan for Secondary Diagnosis: Active Problems:   Psychoactive substance-induced psychosis (HCC)  Long Term Goal(s): Improvement in symptoms so as ready for discharge Improvement in symptoms so as ready for discharge   Short Term Goals: Ability to identify changes in lifestyle to reduce recurrence of  condition will improve Ability to verbalize feelings will improve Ability to demonstrate self-control will improve Ability to identify and develop effective coping behaviors will improve Ability to  maintain clinical measurements within normal limits will improve Ability to identify triggers associated with substance abuse/mental health issues will improve Ability to identify changes in lifestyle to reduce recurrence of condition will improve Ability to verbalize feelings will improve Ability to demonstrate self-control will improve Ability to identify and develop effective coping behaviors will improve Ability to maintain clinical measurements within normal limits will improve Ability to identify triggers associated with substance abuse/mental health issues will improve     Medication Management: Evaluate patient's response, side effects, and tolerance of medication regimen.  Therapeutic Interventions: 1 to 1 sessions, Unit Group sessions and Medication administration.  Evaluation of Outcomes: Adequate for Discharge   RN Treatment Plan for Primary Diagnosis: <principal problem not specified> Long Term Goal(s): Knowledge of disease and therapeutic regimen to maintain health will improve  Short Term Goals: Ability to identify and develop effective coping behaviors will improve  Medication Management: RN will administer medications as ordered by provider, will assess and evaluate patient's response and provide education to patient for prescribed medication. RN will report any adverse and/or side effects to prescribing provider.  Therapeutic Interventions: 1 on 1 counseling sessions, Psychoeducation, Medication administration, Evaluate responses to treatment, Monitor vital signs and CBGs as ordered, Perform/monitor CIWA, COWS, AIMS and Fall Risk screenings as ordered, Perform wound care treatments as ordered.  Evaluation of Outcomes: Adequate for Discharge   LCSW Treatment Plan for Primary Diagnosis: <principal problem not specified> Long Term Goal(s): Safe transition to appropriate next level of care at discharge, Engage patient in therapeutic group addressing interpersonal  concerns.  Short Term Goals: Engage patient in aftercare planning with referrals and resources, Increase social support and Increase skills for wellness and recovery  Therapeutic Interventions: Assess for all discharge needs, 1 to 1 time with Social worker, Explore available resources and support systems, Assess for adequacy in community support network, Educate family and significant other(s) on suicide prevention, Complete Psychosocial Assessment, Interpersonal group therapy.  Evaluation of Outcomes: Adequate for Discharge  Progress in Treatment: Attending groups: Yes. . Participating in groups: Yes. Taking medication as prescribed: Yes. Toleration medication: Yes. Family/Significant other contact made: Yes, individual(s) contacted:  mother. Patient understands diagnosis: No. Discussing patient identified problems/goals with staff: Yes. Medical problems stabilized or resolved: Yes. Denies suicidal/homicidal ideation: Yes. Issues/concerns per patient self-inventory: No.  New problem(s) identified: No, Describe:  none.  New Short Term/Long Term Goal(s): detox, medication management for mood stabilization; elimination of SI thoughts; development of comprehensive mental wellness/sobriety plan.  Patient Goals: "Make it home"  Discharge Plan or Barriers: Patient to return home with mother and to attend appointments for therapy and med management through the [facility].  Reason for Continuation of Hospitalization: Anxiety Medication stabilization  Estimated Length of Stay: Pt to discharge on this date  Attendees: Patient: 09/25/2019   Physician:  09/25/2019   Nursing:  09/25/2019   RN Care Manager: 09/25/2019   Social Worker: Ruthann Cancer, LCSWA 09/25/2019  Recreational Therapist:  09/25/2019   Other:  09/25/2019   Other:  09/25/2019   Other: 09/25/2019     Scribe for Treatment Team: Otelia Santee, LCSWA 09/25/2019 9:25 AM

## 2019-09-25 NOTE — BHH Suicide Risk Assessment (Signed)
[facility] Discharge Suicide Risk Assessment   Principal Problem: <principal problem not specified> Discharge Diagnoses: Active Problems:   Psychoactive substance-induced psychosis (HCC)   Total Time spent with patient: 20 minutes  Musculoskeletal: Strength & Muscle Tone: within normal limits Gait & Station: normal Patient leans: N/A  Psychiatric Specialty Exam: Review of Systems  All other systems reviewed and are negative.   Blood pressure (!) 158/111, pulse 87, temperature 97.6 F (36.4 C), temperature source Oral, resp. rate 18, height 6\' 9"  (2.057 m), weight 95.3 kg, SpO2 100 %.Body mass index is 22.5 kg/m.  General Appearance: Casual  Eye Contact::  Good  Speech:  Normal Rate409  Volume:  Normal  Mood:  Anxious  Affect:  Congruent  Thought Process:  Coherent and Descriptions of Associations: Intact  Orientation:  Full (Time, Place, and Person)  Thought Content:  Delusions  Suicidal Thoughts:  No  Homicidal Thoughts:  No  Memory:  Immediate;   Fair Recent;   Fair Remote;   Fair  Judgement:  Intact  Insight:  Fair  Psychomotor Activity:  Normal  Concentration:  Fair  Recall:  [id number] of Knowledge:Good  Language: Good  Akathisia:  Negative  Handed:  Right  AIMS (if indicated):     Assets:  Desire for Improvement Housing Resilience Social Support  Sleep:  Number of Hours: 7.25  Cognition: WNL  ADL's:  Intact   Mental Status Per Nursing Assessment::   On Admission:  NA  Demographic Factors:  Male, Adolescent or young adult, Low socioeconomic status and Unemployed  Loss Factors: Financial problems/change in socioeconomic status  Historical Factors: Impulsivity  Risk Reduction Factors:   Sense of responsibility to family and Living with another person, especially a relative  Continued Clinical Symptoms:  Alcohol/Substance Abuse/Dependencies Schizophrenia:   Less than 41 years old Paranoid or undifferentiated type  Cognitive Features That Contribute  To Risk:  None    Suicide Risk:  Minimal: No identifiable suicidal ideation.  Patients presenting with no risk factors but with morbid ruminations; may be classified as minimal risk based on the severity of the depressive symptoms   Follow-up Information    [facility]. Go on 10/06/2019.   Specialty: Behavioral Health Why: You are scheduled for therapy on 10/06/19 at 4:00 pm.  This appointment will be held in person.  Please arrive at 3:40 pm.  You also have an appointment for medication management on 10/29/19 at 8:30 am, in person. Contact information: [address] [place] [phone]              Plan Of Care/Follow-up recommendations:  Activity:  ad lib  [phone], MD 09/25/2019, 9:12 AM

## 2019-10-06 ENCOUNTER — Ambulatory Visit (HOSPITAL_COMMUNITY): Admitting: Licensed Clinical Social Worker

## 2019-10-29 ENCOUNTER — Encounter (HOSPITAL_COMMUNITY): Payer: Self-pay | Admitting: Psychiatry

## 2019-10-29 ENCOUNTER — Encounter (HOSPITAL_COMMUNITY): Admitting: Psychiatry

## 2021-01-07 ENCOUNTER — Encounter (HOSPITAL_COMMUNITY): Payer: Self-pay | Admitting: Emergency Medicine

## 2021-01-07 ENCOUNTER — Emergency Department (HOSPITAL_COMMUNITY)
Admission: EM | Admit: 2021-01-07 | Discharge: 2021-01-07 | Disposition: A | Discharge status: Home / Self Care | Payer: Self-pay | Attending: Student | Admitting: Student

## 2021-01-07 ENCOUNTER — Other Ambulatory Visit: Payer: Self-pay

## 2021-01-07 DIAGNOSIS — B029 Zoster without complications: Secondary | ICD-10-CM

## 2021-01-07 DIAGNOSIS — R21 Rash and other nonspecific skin eruption: Secondary | ICD-10-CM | POA: Insufficient documentation

## 2021-01-07 DIAGNOSIS — F17200 Nicotine dependence, unspecified, uncomplicated: Secondary | ICD-10-CM | POA: Insufficient documentation

## 2021-01-07 MED ORDER — VALACYCLOVIR HCL 1 G PO TABS
1000.0000 mg | ORAL_TABLET | Freq: Three times a day (TID) | ORAL | 0 refills | Status: AC
Start: 2021-01-07 — End: ?

## 2021-01-07 NOTE — ED Notes (Signed)
Discharged by PA at triage. 

## 2021-01-07 NOTE — ED Triage Notes (Signed)
C/o painful rash to R abd that extends to R side of back x 2 days.

## 2021-01-07 NOTE — Discharge Instructions (Addendum)
Take Valtrex 3 times daily for the next 7 days.  This should help improve the rash. Follow-up with your PCP if the rash continues. Take Tylenol ibuprofen as needed for pain. Return if things change or worsen.

## 2021-01-07 NOTE — ED Provider Notes (Signed)
[facility] EMERGENCY DEPARTMENT Provider Note   CSN: [id number] Arrival date & time: 01/07/21  6160     History Chief Complaint  Patient presents with   Rash    William Snow is a 30 y.o. male.   Rash Associated symptoms: no fever    Patient presents with rash to the right flank.  Started acutely 2 days ago, associated with tenderness.  No fevers or chills, no history of chickenpox.  Patient denies any immunocompromise state.  He has tried Tylenol with minimal relief.  Touching it is an aggravating factor.  No other associated symptoms.  History reviewed. No pertinent past medical history.  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 No pertinent surgical history.     No family history on file.  Social History   Tobacco Use   Smoking status: Every Day   Smokeless tobacco: Never  Substance Use Topics   Alcohol use: Yes   Drug use: Yes    Types: Marijuana    Comment: pt  reports 28 grams daily    Home Medications Prior to Admission medications   Medication Sig Start Date End Date Taking? Authorizing Provider  carbamazepine (TEGRETOL) 100 MG chewable tablet Chew 2 tablets (200 mg total) by mouth 2 (two) times daily. For mood stabilization 09/25/19   Nwoko, Nicole Kindred I, NP  OLANZapine zydis (ZYPREXA) 20 MG disintegrating tablet Take 1 tablet (20 mg total) by mouth at bedtime. For mood control 09/25/19   Armandina Stammer I, NP  traZODone (DESYREL) 50 MG tablet Take 1 tablet (50 mg total) by mouth at bedtime. For sleep 09/25/19   Armandina Stammer I, NP    Allergies    Patient has no known allergies.  Review of Systems   Review of Systems  Constitutional:  Negative for fever.  Skin:  Positive for rash.   Physical Exam Updated Vital Signs BP (!) 125/49 (BP Location: Left Arm)   Pulse (!) 44   Temp 98.3 F  (36.8 C) (Oral)   Resp 16   SpO2 99%   Physical Exam Vitals and nursing note reviewed. Exam conducted with a chaperone present.  Constitutional:      General: He is not in acute distress.    Appearance: Normal appearance.  HENT:     Head: Normocephalic and atraumatic.  Eyes:     General: No scleral icterus.    Extraocular Movements: Extraocular movements intact.     Pupils: Pupils are equal, round, and reactive to light.  Skin:    Coloration: Skin is not jaundiced.     Findings: Rash present.     Comments: Vesicular rash to the right flank with dermatomal pattern.  Consistent with shingles.  Neurological:     Mental Status: He is alert. Mental status is at baseline.     Coordination: Coordination normal.    ED Results / Procedures / Treatments   Labs (all labs ordered are listed, but only abnormal results are displayed) Labs Reviewed - No data to display  EKG None  Radiology No results found.  Procedures Procedures   Medications Ordered in ED Medications - No data to display  ED Course  I have reviewed the triage vital signs and the nursing notes.  Pertinent labs & imaging results that were available during my care of the patient were reviewed by me and  considered in my medical decision making (see chart for details).    MDM Rules/Calculators/A&P                           Patient vitals are stable, he is nontoxic-appearing.  He has a rash that is most likely shingles.  Started 2 days ago, will start on antiviral medicine.  Advised patient to follow-up with PCP if no improvement.  Discharged in stable position.  Final Clinical Impression(s) / ED Diagnoses Final diagnoses:  None    Rx / DC Orders ED Discharge Orders     None        Theron Arista, PA-C 01/07/21 [id number]    Benjiman Core, MD 01/07/21 1055
